# Patient Record
Sex: Female | Born: 1935
Health system: Southern US, Community
[De-identification: ages and names within clinical notes are randomized; demographics above are authoritative.]

## PROBLEM LIST (undated history)

## (undated) DIAGNOSIS — F411 Generalized anxiety disorder: Secondary | ICD-10-CM

## (undated) DIAGNOSIS — F0390 Unspecified dementia without behavioral disturbance: Secondary | ICD-10-CM

## (undated) DIAGNOSIS — I739 Peripheral vascular disease, unspecified: Secondary | ICD-10-CM

## (undated) DIAGNOSIS — J8410 Pulmonary fibrosis, unspecified: Secondary | ICD-10-CM

## (undated) DIAGNOSIS — G250 Essential tremor: Secondary | ICD-10-CM

## (undated) DIAGNOSIS — I10 Essential (primary) hypertension: Secondary | ICD-10-CM

## (undated) DIAGNOSIS — K219 Gastro-esophageal reflux disease without esophagitis: Secondary | ICD-10-CM

## (undated) DIAGNOSIS — I2510 Atherosclerotic heart disease of native coronary artery without angina pectoris: Secondary | ICD-10-CM

## (undated) DIAGNOSIS — E785 Hyperlipidemia, unspecified: Secondary | ICD-10-CM

## (undated) HISTORY — PX: KNEE ARTHROPLASTY: SHX992

## (undated) HISTORY — PX: CHOLECYSTECTOMY: SHX55

## (undated) HISTORY — PX: ABDOMINAL HYSTERECTOMY: SHX81

## (undated) HISTORY — PX: CORONARY ARTERY BYPASS GRAFT: SHX141

---

## 2019-07-15 ENCOUNTER — Ambulatory Visit
Admission: EM | Admit: 2019-07-15 | Discharge: 2019-07-15 | Disposition: A | Discharge status: Home / Self Care | Payer: Medicare HMO | Attending: Family Medicine | Admitting: Family Medicine

## 2019-07-15 ENCOUNTER — Encounter: Payer: Self-pay | Admitting: Emergency Medicine

## 2019-07-15 ENCOUNTER — Other Ambulatory Visit: Payer: Self-pay

## 2019-07-15 DIAGNOSIS — I10 Essential (primary) hypertension: Secondary | ICD-10-CM

## 2019-07-15 HISTORY — DX: Essential (primary) hypertension: I10

## 2019-07-15 NOTE — Discharge Instructions (Addendum)
Continue current medications including clonidine as needed for elevated blood pressures

## 2019-07-15 NOTE — ED Triage Notes (Signed)
Patient states that her blood pressure has been elevated for the past 2 days.  Patient denies HAS or vision changes.

## 2019-07-15 NOTE — ED Provider Notes (Signed)
[facility]    CSN: [id number] Arrival date & time: 07/15/19  1406     History   Chief Complaint Chief Complaint  Patient presents with  . Hypertension    HPI Alexis Jackson is a 85 y.o. female.   85 yo female accompanied by son with a c/o high blood pressure for the past 2 days. Patient states readings have been [phone] systolic. Denies any headaches, vision changes, one-sided weakness, numbness/tingling. Patient is on multiple daily blood pressure medications, including clonidine which is to be taken as needed. However she did not take it today because they wanted to check that her blood pressure machine was accurate.  Patient recently moved here from [place] and will be setting up with a PCP at [facility] clinic.     Hypertension    Past Medical History:  Diagnosis Date  . Hypertension     There are no active problems to display for this patient.   History reviewed. No pertinent surgical history.  OB History   No obstetric history on file.      Home Medications    Prior to Admission medications   Medication Sig Start Date End Date Taking? Authorizing Provider  aspirin EC 81 MG tablet Take 81 mg by mouth daily.   Yes [provider]  clonazePAM (KLONOPIN) 0.5 MG tablet Take 0.5 mg by mouth 2 (two) times daily as needed for anxiety.   Yes [provider]  cloNIDine (CATAPRES) 0.1 MG tablet Take 0.1 mg by mouth as needed.   Yes [provider]  famotidine (PEPCID) 40 MG tablet Take 40 mg by mouth daily.   Yes [provider]  gabapentin (NEURONTIN) 100 MG capsule  02/03/19  Yes [provider]  losartan-hydrochlorothiazide (HYZAAR) 100-12.5 MG tablet  05/20/19  Yes [provider]  propranolol (INDERAL) 10 MG tablet  05/15/19  Yes [provider]  amLODipine (NORVASC) 10 MG tablet  02/04/19   [provider]  amLODipine (NORVASC) 2.5 MG tablet  05/04/19   [provider]   busPIRone (BUSPAR) 5 MG tablet  07/09/19   [provider]    Family History History reviewed. No pertinent family history.  Social History Social History   Tobacco Use  . Smoking status: Former Research scientist (life sciences)  . Smokeless tobacco: Never Used  Substance Use Topics  . Alcohol use: Never    Frequency: Never  . Drug use: Never     Allergies   Sulfa antibiotics   Review of Systems Review of Systems   Physical Exam Triage Vital Signs ED Triage Vitals  Enc Vitals Group     BP 07/15/19 1441 (!) 155/62     Pulse Rate 07/15/19 1441 (!) 59     Resp 07/15/19 1441 14     Temp 07/15/19 1441 98.1 F (36.7 C)     Temp Source 07/15/19 1441 Oral     SpO2 07/15/19 1441 98 %     Weight 07/15/19 1440 115 lb (52.2 kg)     Height 07/15/19 1440 5\' 8"  (1.727 m)     Head Circumference --      Peak Flow --      Pain Score 07/15/19 1440 0     Pain Loc --      Pain Edu? --      Excl. in [place]? --    No data found.  Updated Vital Signs BP (!) 155/62 (BP Location: Left Arm)   Pulse (!) 59   Temp 98.1  F (36.7 C) (Oral)   Resp 14   Ht 5\' 8"  (1.727 m)   Wt 52.2 kg   SpO2 98%   BMI 17.49 kg/m   Visual Acuity Right Eye Distance:   Left Eye Distance:   Bilateral Distance:    Right Eye Near:   Left Eye Near:    Bilateral Near:     Physical Exam Vitals signs and nursing note reviewed.  Constitutional:      General: She is not in acute distress.    Appearance: She is not toxic-appearing or diaphoretic.  Cardiovascular:     Rate and Rhythm: Normal rate.  Pulmonary:     Effort: Pulmonary effort is normal. No respiratory distress.  Neurological:     Mental Status: She is alert.      UC Treatments / Results  Labs (all labs ordered are listed, but only abnormal results are displayed) Labs Reviewed - No data to display  EKG   Radiology No results found.  Procedures Procedures (including critical care time)  Medications Ordered in UC Medications - No data to  display  Initial Impression / Assessment and Plan / UC Course  I have reviewed the triage vital signs and the nursing notes.  Pertinent labs & imaging results that were available during my care of the patient were reviewed by me and considered in my medical decision making (see chart for details).      Final Clinical Impressions(s) / UC Diagnoses   Final diagnoses:  Hypertension, unspecified type     Discharge Instructions     Continue current medications including clonidine as needed for elevated blood pressures    ED Prescriptions    None      1.  diagnosis reviewed with patient and son 2. Recommend continue current medications 3. Establish care with new PCP as scheduled 4. Follow up prn   Controlled Substance Prescriptions [place] Controlled Substance Registry consulted? Not Applicable   Norval Gable, MD 07/15/19 [phone]

## 2019-08-23 DIAGNOSIS — Z79899 Other long term (current) drug therapy: Secondary | ICD-10-CM | POA: Insufficient documentation

## 2019-08-24 DIAGNOSIS — N183 Chronic kidney disease, stage 3 (moderate): Secondary | ICD-10-CM | POA: Diagnosis not present

## 2019-08-24 DIAGNOSIS — I739 Peripheral vascular disease, unspecified: Secondary | ICD-10-CM | POA: Diagnosis not present

## 2019-08-24 DIAGNOSIS — F0280 Dementia in other diseases classified elsewhere without behavioral disturbance: Secondary | ICD-10-CM | POA: Diagnosis not present

## 2019-08-24 DIAGNOSIS — I700 Atherosclerosis of aorta: Secondary | ICD-10-CM | POA: Diagnosis not present

## 2019-08-24 DIAGNOSIS — E559 Vitamin D deficiency, unspecified: Secondary | ICD-10-CM | POA: Diagnosis not present

## 2019-08-24 DIAGNOSIS — G250 Essential tremor: Secondary | ICD-10-CM | POA: Diagnosis not present

## 2019-08-24 DIAGNOSIS — I129 Hypertensive chronic kidney disease with stage 1 through stage 4 chronic kidney disease, or unspecified chronic kidney disease: Secondary | ICD-10-CM | POA: Diagnosis not present

## 2019-08-24 DIAGNOSIS — E785 Hyperlipidemia, unspecified: Secondary | ICD-10-CM | POA: Diagnosis not present

## 2019-08-24 DIAGNOSIS — F411 Generalized anxiety disorder: Secondary | ICD-10-CM | POA: Diagnosis not present

## 2019-08-28 DIAGNOSIS — L97522 Non-pressure chronic ulcer of other part of left foot with fat layer exposed: Secondary | ICD-10-CM | POA: Diagnosis not present

## 2019-08-28 DIAGNOSIS — M2041 Other hammer toe(s) (acquired), right foot: Secondary | ICD-10-CM | POA: Diagnosis not present

## 2019-08-28 DIAGNOSIS — M2042 Other hammer toe(s) (acquired), left foot: Secondary | ICD-10-CM | POA: Diagnosis not present

## 2019-08-28 DIAGNOSIS — G629 Polyneuropathy, unspecified: Secondary | ICD-10-CM | POA: Diagnosis not present

## 2019-08-28 DIAGNOSIS — M19072 Primary osteoarthritis, left ankle and foot: Secondary | ICD-10-CM | POA: Diagnosis not present

## 2019-08-28 DIAGNOSIS — M19071 Primary osteoarthritis, right ankle and foot: Secondary | ICD-10-CM | POA: Diagnosis not present

## 2019-09-17 ENCOUNTER — Emergency Department: Payer: Medicare HMO

## 2019-09-17 ENCOUNTER — Inpatient Hospital Stay
Admission: EM | Admit: 2019-09-17 | Discharge: 2019-09-23 | DRG: 470 | Disposition: A | Discharge status: Skilled Nursing Facility | Payer: Medicare HMO | Attending: Specialist | Admitting: Specialist

## 2019-09-17 ENCOUNTER — Other Ambulatory Visit: Payer: Self-pay

## 2019-09-17 DIAGNOSIS — Z96659 Presence of unspecified artificial knee joint: Secondary | ICD-10-CM | POA: Diagnosis present

## 2019-09-17 DIAGNOSIS — W19XXXA Unspecified fall, initial encounter: Secondary | ICD-10-CM | POA: Diagnosis not present

## 2019-09-17 DIAGNOSIS — S72002S Fracture of unspecified part of neck of left femur, sequela: Secondary | ICD-10-CM | POA: Diagnosis not present

## 2019-09-17 DIAGNOSIS — K219 Gastro-esophageal reflux disease without esophagitis: Secondary | ICD-10-CM | POA: Diagnosis present

## 2019-09-17 DIAGNOSIS — R001 Bradycardia, unspecified: Secondary | ICD-10-CM | POA: Diagnosis not present

## 2019-09-17 DIAGNOSIS — Y92000 Kitchen of unspecified non-institutional (private) residence as  the place of occurrence of the external cause: Secondary | ICD-10-CM

## 2019-09-17 DIAGNOSIS — I445 Left posterior fascicular block: Secondary | ICD-10-CM | POA: Diagnosis present

## 2019-09-17 DIAGNOSIS — S72002A Fracture of unspecified part of neck of left femur, initial encounter for closed fracture: Secondary | ICD-10-CM | POA: Diagnosis not present

## 2019-09-17 DIAGNOSIS — Z471 Aftercare following joint replacement surgery: Secondary | ICD-10-CM | POA: Diagnosis not present

## 2019-09-17 DIAGNOSIS — Z9049 Acquired absence of other specified parts of digestive tract: Secondary | ICD-10-CM

## 2019-09-17 DIAGNOSIS — Z20828 Contact with and (suspected) exposure to other viral communicable diseases: Secondary | ICD-10-CM | POA: Diagnosis present

## 2019-09-17 DIAGNOSIS — M79605 Pain in left leg: Secondary | ICD-10-CM | POA: Diagnosis not present

## 2019-09-17 DIAGNOSIS — J8410 Pulmonary fibrosis, unspecified: Secondary | ICD-10-CM | POA: Diagnosis present

## 2019-09-17 DIAGNOSIS — W010XXA Fall on same level from slipping, tripping and stumbling without subsequent striking against object, initial encounter: Secondary | ICD-10-CM | POA: Diagnosis present

## 2019-09-17 DIAGNOSIS — E785 Hyperlipidemia, unspecified: Secondary | ICD-10-CM | POA: Diagnosis not present

## 2019-09-17 DIAGNOSIS — R000 Tachycardia, unspecified: Secondary | ICD-10-CM | POA: Diagnosis not present

## 2019-09-17 DIAGNOSIS — I2510 Atherosclerotic heart disease of native coronary artery without angina pectoris: Secondary | ICD-10-CM | POA: Diagnosis present

## 2019-09-17 DIAGNOSIS — Z79899 Other long term (current) drug therapy: Secondary | ICD-10-CM

## 2019-09-17 DIAGNOSIS — Z882 Allergy status to sulfonamides status: Secondary | ICD-10-CM | POA: Diagnosis not present

## 2019-09-17 DIAGNOSIS — F411 Generalized anxiety disorder: Secondary | ICD-10-CM | POA: Diagnosis present

## 2019-09-17 DIAGNOSIS — S72009A Fracture of unspecified part of neck of unspecified femur, initial encounter for closed fracture: Secondary | ICD-10-CM | POA: Diagnosis not present

## 2019-09-17 DIAGNOSIS — Z90710 Acquired absence of both cervix and uterus: Secondary | ICD-10-CM | POA: Diagnosis not present

## 2019-09-17 DIAGNOSIS — R339 Retention of urine, unspecified: Secondary | ICD-10-CM | POA: Diagnosis not present

## 2019-09-17 DIAGNOSIS — I4510 Unspecified right bundle-branch block: Secondary | ICD-10-CM | POA: Diagnosis present

## 2019-09-17 DIAGNOSIS — Z419 Encounter for procedure for purposes other than remedying health state, unspecified: Secondary | ICD-10-CM

## 2019-09-17 DIAGNOSIS — S72001A Fracture of unspecified part of neck of right femur, initial encounter for closed fracture: Secondary | ICD-10-CM | POA: Diagnosis not present

## 2019-09-17 DIAGNOSIS — Z809 Family history of malignant neoplasm, unspecified: Secondary | ICD-10-CM | POA: Diagnosis not present

## 2019-09-17 DIAGNOSIS — R52 Pain, unspecified: Secondary | ICD-10-CM | POA: Diagnosis not present

## 2019-09-17 DIAGNOSIS — Z7401 Bed confinement status: Secondary | ICD-10-CM | POA: Diagnosis not present

## 2019-09-17 DIAGNOSIS — Z87891 Personal history of nicotine dependence: Secondary | ICD-10-CM | POA: Diagnosis not present

## 2019-09-17 DIAGNOSIS — I10 Essential (primary) hypertension: Secondary | ICD-10-CM | POA: Diagnosis present

## 2019-09-17 DIAGNOSIS — Z96649 Presence of unspecified artificial hip joint: Secondary | ICD-10-CM

## 2019-09-17 DIAGNOSIS — D62 Acute posthemorrhagic anemia: Secondary | ICD-10-CM | POA: Diagnosis not present

## 2019-09-17 DIAGNOSIS — I959 Hypotension, unspecified: Secondary | ICD-10-CM | POA: Diagnosis not present

## 2019-09-17 DIAGNOSIS — M6281 Muscle weakness (generalized): Secondary | ICD-10-CM | POA: Diagnosis not present

## 2019-09-17 DIAGNOSIS — Z01811 Encounter for preprocedural respiratory examination: Secondary | ICD-10-CM

## 2019-09-17 DIAGNOSIS — F0390 Unspecified dementia without behavioral disturbance: Secondary | ICD-10-CM | POA: Diagnosis present

## 2019-09-17 DIAGNOSIS — Y9301 Activity, walking, marching and hiking: Secondary | ICD-10-CM | POA: Diagnosis present

## 2019-09-17 DIAGNOSIS — Z01810 Encounter for preprocedural cardiovascular examination: Secondary | ICD-10-CM | POA: Diagnosis not present

## 2019-09-17 DIAGNOSIS — Z951 Presence of aortocoronary bypass graft: Secondary | ICD-10-CM | POA: Diagnosis not present

## 2019-09-17 DIAGNOSIS — S7292XA Unspecified fracture of left femur, initial encounter for closed fracture: Secondary | ICD-10-CM | POA: Diagnosis not present

## 2019-09-17 DIAGNOSIS — I739 Peripheral vascular disease, unspecified: Secondary | ICD-10-CM | POA: Diagnosis present

## 2019-09-17 DIAGNOSIS — G629 Polyneuropathy, unspecified: Secondary | ICD-10-CM | POA: Diagnosis present

## 2019-09-17 DIAGNOSIS — M2550 Pain in unspecified joint: Secondary | ICD-10-CM | POA: Diagnosis not present

## 2019-09-17 DIAGNOSIS — Z7982 Long term (current) use of aspirin: Secondary | ICD-10-CM

## 2019-09-17 DIAGNOSIS — S7002XA Contusion of left hip, initial encounter: Secondary | ICD-10-CM | POA: Diagnosis not present

## 2019-09-17 DIAGNOSIS — Z96642 Presence of left artificial hip joint: Secondary | ICD-10-CM | POA: Diagnosis not present

## 2019-09-17 DIAGNOSIS — Z03818 Encounter for observation for suspected exposure to other biological agents ruled out: Secondary | ICD-10-CM | POA: Diagnosis not present

## 2019-09-17 DIAGNOSIS — I25110 Atherosclerotic heart disease of native coronary artery with unstable angina pectoris: Secondary | ICD-10-CM | POA: Diagnosis not present

## 2019-09-17 DIAGNOSIS — W19XXXS Unspecified fall, sequela: Secondary | ICD-10-CM | POA: Diagnosis not present

## 2019-09-17 DIAGNOSIS — F39 Unspecified mood [affective] disorder: Secondary | ICD-10-CM | POA: Diagnosis not present

## 2019-09-17 DIAGNOSIS — Z01818 Encounter for other preprocedural examination: Secondary | ICD-10-CM | POA: Diagnosis not present

## 2019-09-17 DIAGNOSIS — I517 Cardiomegaly: Secondary | ICD-10-CM | POA: Diagnosis not present

## 2019-09-17 HISTORY — DX: Essential tremor: G25.0

## 2019-09-17 HISTORY — DX: Generalized anxiety disorder: F41.1

## 2019-09-17 HISTORY — DX: Unspecified dementia, unspecified severity, without behavioral disturbance, psychotic disturbance, mood disturbance, and anxiety: F03.90

## 2019-09-17 HISTORY — DX: Atherosclerotic heart disease of native coronary artery without angina pectoris: I25.10

## 2019-09-17 HISTORY — DX: Hyperlipidemia, unspecified: E78.5

## 2019-09-17 HISTORY — DX: Peripheral vascular disease, unspecified: I73.9

## 2019-09-17 HISTORY — DX: Pulmonary fibrosis, unspecified: J84.10

## 2019-09-17 HISTORY — DX: Gastro-esophageal reflux disease without esophagitis: K21.9

## 2019-09-17 LAB — CBC WITH DIFFERENTIAL/PLATELET
Abs Immature Granulocytes: 0.06 10*3/uL (ref 0.00–0.07)
Basophils Absolute: 0 10*3/uL (ref 0.0–0.1)
Basophils Relative: 0 %
Eosinophils Absolute: 0.1 10*3/uL (ref 0.0–0.5)
Eosinophils Relative: 1 %
HCT: 34 % — ABNORMAL LOW (ref 36.0–46.0)
Hemoglobin: 11.3 g/dL — ABNORMAL LOW (ref 12.0–15.0)
Immature Granulocytes: 0 %
Lymphocytes Relative: 8 %
Lymphs Abs: 1.1 10*3/uL (ref 0.7–4.0)
MCH: 30.1 pg (ref 26.0–34.0)
MCHC: 33.2 g/dL (ref 30.0–36.0)
MCV: 90.7 fL (ref 80.0–100.0)
Monocytes Absolute: 0.8 10*3/uL (ref 0.1–1.0)
Monocytes Relative: 6 %
Neutro Abs: 11.5 10*3/uL — ABNORMAL HIGH (ref 1.7–7.7)
Neutrophils Relative %: 85 %
Platelets: 205 10*3/uL (ref 150–400)
RBC: 3.75 MIL/uL — ABNORMAL LOW (ref 3.87–5.11)
RDW: 13.1 % (ref 11.5–15.5)
WBC: 13.6 10*3/uL — ABNORMAL HIGH (ref 4.0–10.5)
nRBC: 0 % (ref 0.0–0.2)

## 2019-09-17 LAB — BASIC METABOLIC PANEL
Anion gap: 11 (ref 5–15)
BUN: 28 mg/dL — ABNORMAL HIGH (ref 8–23)
CO2: 22 mmol/L (ref 22–32)
Calcium: 9.4 mg/dL (ref 8.9–10.3)
Chloride: 102 mmol/L (ref 98–111)
Creatinine, Ser: 1.07 mg/dL — ABNORMAL HIGH (ref 0.44–1.00)
GFR calc Af Amer: 53 mL/min — ABNORMAL LOW (ref >60)
GFR calc non Af Amer: 46 mL/min — ABNORMAL LOW (ref >60)
Glucose, Bld: 131 mg/dL — ABNORMAL HIGH (ref 70–99)
Potassium: 3.9 mmol/L (ref 3.5–5.1)
Sodium: 135 mmol/L (ref 135–145)

## 2019-09-17 LAB — PROTIME-INR
INR: 1.1 (ref 0.8–1.2)
Prothrombin Time: 13.7 seconds (ref 11.4–15.2)

## 2019-09-17 LAB — SARS CORONAVIRUS 2 BY RT PCR (HOSPITAL ORDER, PERFORMED IN ~~LOC~~ HOSPITAL LAB): SARS Coronavirus 2: NEGATIVE

## 2019-09-17 MED ORDER — CEFAZOLIN SODIUM-DEXTROSE 1-4 GM/50ML-% IV SOLN
1.0000 g | INTRAVENOUS | Status: AC
  Administered 2019-09-18: 12:00:00 1 g via INTRAVENOUS

## 2019-09-17 MED ORDER — PHENAZOPYRIDINE HCL 200 MG PO TABS
200.0000 mg | ORAL_TABLET | Freq: Once | ORAL | Status: AC
  Administered 2019-09-18: 200 mg via ORAL
  Filled 2019-09-17: qty 1

## 2019-09-17 MED ORDER — CEFAZOLIN SODIUM-DEXTROSE 1-4 GM/50ML-% IV SOLN: 1.0000 g | Freq: Three times a day (TID) | INTRAVENOUS | Status: DC

## 2019-09-17 MED ORDER — FENTANYL CITRATE (PF) 100 MCG/2ML IJ SOLN
25.0000 ug | Freq: Once | INTRAMUSCULAR | Status: AC
  Administered 2019-09-17: 25 ug via INTRAVENOUS
  Filled 2019-09-17: qty 2

## 2019-09-17 NOTE — ED Notes (Signed)
ED TO INPATIENT HANDOFF REPORT  ED Nurse Name and Phone #: gracie  S Name/Age/Gender [name] 85 y.o. female Room/Bed: ED04A/ED04A  Code Status   Code Status: Not on file  Home/SNF/Other Home Patient oriented to: self, place, time and situation Is this baseline? Yes   Triage Complete: Triage complete  Chief Complaint fall left leg pain ems  Triage Note Pt to ED via ems from home, pt c/o left leg pain post mechanical fall. Pt states she is on blood thinners. No obvious deformity noted. Did not hit head and denies LOC. Pulses present in affected leg.    Allergies Allergies  Allergen Reactions  . Sulfa Antibiotics Rash    Level of Care/Admitting Diagnosis ED Disposition    ED Disposition Condition [facility]: [place] [[id number]]  Level of Care: Med-Surg [16]  Covid Evaluation: Asymptomatic Screening Protocol (No Symptoms)  [facility]: [id number]  Admitting Physician: Lance Coon [id number]  Attending Physician: Lance Coon [phone]  Estimated length of stay: past midnight tomorrow  Certification:: I certify this patient will need inpatient services for at least 2 midnights  PT Class (Do Not Modify): Inpatient [101]  PT Acc Code (Do Not Modify): Private [1]       B Medical/Surgery History Past Medical History:  Diagnosis Date  . Benign essential tremor   . CAD (coronary artery disease)   . Dementia ([place])   . GAD (generalized anxiety disorder)   . GERD (gastroesophageal reflux disease)   . HLD (hyperlipidemia)   . Hypertension   . PAD (peripheral artery disease) ([place])   . Pulmonary fibrosis ([place])    Past Surgical History:  Procedure Laterality Date  . ABDOMINAL HYSTERECTOMY    . CHOLECYSTECTOMY    . CORONARY ARTERY BYPASS GRAFT    . KNEE ARTHROPLASTY       A IV Location/Drains/Wounds Patient Lines/Drains/Airways Status   Active Line/Drains/Airways    Name:   Placement date:    Placement time:   Site:   Days:   Urethral Catheter gracie rn Double-lumen 14 Fr.   09/17/19    2324    Double-lumen   less than 1          Intake/Output Last 24 hours No intake or output data in the 24 hours ending 09/17/19 2324  Labs/Imaging Results for orders placed or performed during the hospital encounter of 09/17/19 (from the past 48 hour(s))  CBC with Differential     Status: Abnormal   Collection Time: 09/17/19  9:52 PM  Result Value Ref Range   WBC 13.6 (H) 4.0 - 10.5 K/uL   RBC 3.75 (L) 3.87 - 5.11 MIL/uL   Hemoglobin 11.3 (L) 12.0 - 15.0 g/dL   HCT 34.0 (L) 36.0 - 46.0 %   MCV 90.7 80.0 - 100.0 fL   MCH 30.1 26.0 - 34.0 pg   MCHC 33.2 30.0 - 36.0 g/dL   RDW 13.1 11.5 - 15.5 %   Platelets 205 150 - 400 K/uL   nRBC 0.0 0.0 - 0.2 %   Neutrophils Relative % 85 %   Neutro Abs 11.5 (H) 1.7 - 7.7 K/uL   Lymphocytes Relative 8 %   Lymphs Abs 1.1 0.7 - 4.0 K/uL   Monocytes Relative 6 %   Monocytes Absolute 0.8 0.1 - 1.0 K/uL   Eosinophils Relative 1 %   Eosinophils Absolute 0.1 0.0 - 0.5 K/uL   Basophils Relative 0 %   Basophils Absolute  0.0 0.0 - 0.1 K/uL   Immature Granulocytes 0 %   Abs Immature Granulocytes 0.06 0.00 - 0.07 K/uL    Comment: Performed at [facility], [address] [place], [place] [address]  Protime-INR     Status: None   Collection Time: 09/17/19  9:52 PM  Result Value Ref Range   Prothrombin Time 13.7 11.4 - 15.2 seconds   INR 1.1 0.8 - 1.2    Comment: (NOTE) INR goal varies based on device and disease states. Performed at [facility], [address] [place], [place] [id number]   Basic metabolic panel     Status: Abnormal   Collection Time: 09/17/19  9:52 PM  Result Value Ref Range   Sodium 135 135 - 145 mmol/L   Potassium 3.9 3.5 - 5.1 mmol/L   Chloride 102 98 - 111 mmol/L   CO2 22 22 - 32 mmol/L   Glucose, Bld 131 (H) 70 - 99 mg/dL   BUN 28 (H) 8 - 23 mg/dL   Creatinine, Ser 1.07 (H) 0.44 - 1.00 mg/dL    Calcium 9.4 8.9 - 10.3 mg/dL   GFR calc non Af Amer 46 (L) >60 mL/min   GFR calc Af Amer 53 (L) >60 mL/min   Anion gap 11 5 - 15    Comment: Performed at [facility], [address] [place], [place] [address]  SARS Coronavirus 2 [facility] order, Performed in [facility] hospital lab) Nasopharyngeal Nasopharyngeal Swab     Status: None   Collection Time: 09/17/19  9:52 PM   Specimen: Nasopharyngeal Swab  Result Value Ref Range   SARS Coronavirus 2 NEGATIVE NEGATIVE    Comment: (NOTE) If result is NEGATIVE SARS-CoV-2 target nucleic acids are NOT DETECTED. The SARS-CoV-2 RNA is generally detectable in upper and lower  respiratory specimens during the acute phase of infection. The lowest  concentration of SARS-CoV-2 viral copies this assay can detect is 250  copies / mL. A negative result does not preclude SARS-CoV-2 infection  and should not be used as the sole basis for treatment or other  patient management decisions.  A negative result may occur with  improper specimen collection / handling, submission of specimen other  than nasopharyngeal swab, presence of viral mutation(s) within the  areas targeted by this assay, and inadequate number of viral copies  (<250 copies / mL). A negative result must be combined with clinical  observations, patient history, and epidemiological information. If result is POSITIVE SARS-CoV-2 target nucleic acids are DETECTED. The SARS-CoV-2 RNA is generally detectable in upper and lower  respiratory specimens dur ing the acute phase of infection.  Positive  results are indicative of active infection with SARS-CoV-2.  Clinical  correlation with patient history and other diagnostic information is  necessary to determine patient infection status.  Positive results do  not rule out bacterial infection or co-infection with other viruses. If result is PRESUMPTIVE POSTIVE SARS-CoV-2 nucleic acids MAY BE PRESENT.   A presumptive positive result was  obtained on the submitted specimen  and confirmed on repeat testing.  While 2019 novel coronavirus  (SARS-CoV-2) nucleic acids may be present in the submitted sample  additional confirmatory testing may be necessary for epidemiological  and / or clinical management purposes  to differentiate between  SARS-CoV-2 and other Sarbecovirus currently known to infect humans.  If clinically indicated additional testing with an alternate test  methodology [phone]) is advised. The SARS-CoV-2 RNA is generally  detectable in upper and lower  respiratory sp ecimens during the acute  phase of infection. The expected result is Negative. Fact Sheet for Patients:  StrictlyIdeas.no Fact Sheet for Healthcare Providers: BankingDealers.co.za This test is not yet approved or cleared by the [place] FDA and has been authorized for detection and/or diagnosis of SARS-CoV-2 by FDA under an Emergency Use Authorization (EUA).  This EUA will remain in effect (meaning this test can be used) for the duration of the COVID-19 declaration under Section 564(b)(1) of the Act, 21 U.S.C. section 360bbb-3(b)(1), unless the authorization is terminated or revoked sooner. Performed at [facility], [address] [place],  [address]    Dg Knee 1-2 Views Left  Result Date: 09/17/2019 CLINICAL DATA:  Left leg pain secondary to a fall. EXAM: LEFT KNEE - 1-2 VIEW COMPARISON:  None. FINDINGS: There is no fracture or dislocation or joint effusion. The components of the left total knee prosthesis appear in good position with no loosening. Diffuse osteopenia. IMPRESSION: No acute abnormality. The components of the left total knee prosthesis are in good position with no loosening. Electronically Signed   By: Lorriane Shire M.D.   On: 09/17/2019 20:53   Ct Hip Left Wo Contrast  Result Date: 09/17/2019 CLINICAL DATA:  Left hip fracture secondary to a fall at home. EXAM:  CT OF THE LEFT HIP WITHOUT CONTRAST TECHNIQUE: Multidetector CT imaging of the left hip was performed according to the standard protocol. Multiplanar CT image reconstructions were also generated. COMPARISON:  Radiographs dated 09/17/2019 FINDINGS: Bones/Joint/Cartilage There is a slightly comminuted angulated overriding benign-appearing fracture of the left femoral neck. Muscles and Tendons Normal. Soft tissues Prominent contusion of the subcutaneous fat lateral to the left greater trochanter. IMPRESSION: 1. Slightly comminuted angulated overriding benign-appearing fracture of the left femoral neck. 2. Prominent contusion of the subcutaneous fat lateral to the left greater trochanter. Electronically Signed   By: Lorriane Shire M.D.   On: 09/17/2019 21:57   Dg Chest Port 1 View  Result Date: 09/17/2019 CLINICAL DATA:  Pre operative respiratory exam. EXAM: PORTABLE CHEST 1 VIEW COMPARISON:  None FINDINGS: Mild cardiomegaly. Aortic atherosclerosis. CABG. No infiltrates or effusions. No acute bone abnormality. IMPRESSION: 1. Mild cardiomegaly. 2. Aortic atherosclerosis. 3. No acute abnormalities. Electronically Signed   By: Lorriane Shire M.D.   On: 09/17/2019 21:50   Dg Hip Unilat With Pelvis 2-3 Views Left  Result Date: 09/17/2019 CLINICAL DATA:  Left leg pain after a fall. EXAM: DG HIP (WITH OR WITHOUT PELVIS) 2-3V LEFT COMPARISON:  None. FINDINGS: There is an angulated left femoral neck fracture. No dislocation of the femoral head. Diffuse osteopenia. Aortic atherosclerosis. IMPRESSION: 1. Left femoral neck fracture. No dislocation of the femoral head. 2. Aortic atherosclerosis. Electronically Signed   By: Lorriane Shire M.D.   On: 09/17/2019 20:52    Pending Labs [employer] (From admission, onward)    Start     Ordered   Signed and Occupational hygienist morning,   R     Signed and Held   Signed and Held  CBC  Tomorrow morning,   R     Signed and Held           Vitals/Pain Today's Vitals   09/17/19 1959 09/17/19 2005 09/17/19 2254  BP:  (!) 158/68   Pulse:  61   Resp:  16   Temp:  97.8 F (36.6 C)   TempSrc:  Oral   SpO2:  98%   Weight: 59 kg  Height: 5\' 7"  (1.702 m)    PainSc: 10-Worst pain ever  0-No pain    Isolation Precautions No active isolations  Medications Medications  ceFAZolin (ANCEF) IVPB 1 g/50 mL premix (has no administration in time range)  fentaNYL (SUBLIMAZE) injection 25 mcg (25 mcg Intravenous Given 09/17/19 2149)    Mobility walks with device Low fall risk   Focused Assessments ortho   R Recommendations: See Admitting Provider Note  Report given to:   Additional Notes:

## 2019-09-17 NOTE — ED Triage Notes (Signed)
Pt to ED via ems from home, pt c/o left leg pain post mechanical fall. Pt states she is on blood thinners. No obvious deformity noted. Did not hit head and denies LOC. Pulses present in affected leg.

## 2019-09-17 NOTE — H&P (Signed)
[facility] [place] NAME: Alexis Jackson    MR#:  [id number]  DATE OF BIRTH:  06-12-1927  DATE OF ADMISSION:  09/17/2019  PRIMARY CARE PHYSICIAN: Ezequiel Kayser, MD   REQUESTING/REFERRING PHYSICIAN: Joan Mayans, MD  CHIEF COMPLAINT:   Chief Complaint  Patient presents with  . Fall    HISTORY OF PRESENT ILLNESS:  Alexis Jackson  is a 85 y.o. female who presents with chief complaint as above.  Patient presents the ED after a fall at home with complaint of left hip pain.  She is found on imaging here to have a left hip fracture.  Orthopedic surgery was contacted by ED physician and they plan for operative repair tomorrow.  However, the patient does have a history of coronary bypass, and orthopedics and anesthesia would like cardiology consult for cardiac clearance prior to surgery.  Hospitalist were called for admission  PAST MEDICAL HISTORY:   Past Medical History:  Diagnosis Date  . Benign essential tremor   . CAD (coronary artery disease)   . Dementia ([place])   . GAD (generalized anxiety disorder)   . GERD (gastroesophageal reflux disease)   . HLD (hyperlipidemia)   . Hypertension   . PAD (peripheral artery disease) ([place])   . Pulmonary fibrosis ([place])      PAST SURGICAL HISTORY:   Past Surgical History:  Procedure Laterality Date  . ABDOMINAL HYSTERECTOMY    . CHOLECYSTECTOMY    . CORONARY ARTERY BYPASS GRAFT    . KNEE ARTHROPLASTY       SOCIAL HISTORY:   Social History   Tobacco Use  . Smoking status: Former Research scientist (life sciences)  . Smokeless tobacco: Never Used  Substance Use Topics  . Alcohol use: Never    Frequency: Never     FAMILY HISTORY:   Family History  Problem Relation Age of Onset  . Testicular cancer Father      DRUG ALLERGIES:   Allergies  Allergen Reactions  . Sulfa Antibiotics Rash    MEDICATIONS AT HOME:   Prior to Admission medications   Medication Sig Start Date End Date Taking? Authorizing  Provider  amLODipine (NORVASC) 10 MG tablet  02/04/19   [provider]  amLODipine (NORVASC) 2.5 MG tablet  05/04/19   [provider]  aspirin EC 81 MG tablet Take 81 mg by mouth daily.    [provider]  busPIRone (BUSPAR) 5 MG tablet  07/09/19   [provider]  clonazePAM (KLONOPIN) 0.5 MG tablet Take 0.5 mg by mouth 2 (two) times daily as needed for anxiety.    [provider]  cloNIDine (CATAPRES) 0.1 MG tablet Take 0.1 mg by mouth as needed.    [provider]  famotidine (PEPCID) 40 MG tablet Take 40 mg by mouth daily.    [provider]  gabapentin (NEURONTIN) 100 MG capsule  02/03/19   [provider]  losartan-hydrochlorothiazide Konrad Penta) 100-12.5 MG tablet  05/20/19   [provider]  propranolol (INDERAL) 10 MG tablet  05/15/19   [provider]    REVIEW OF SYSTEMS:  Review of Systems  Constitutional: Negative for chills, fever, malaise/fatigue and weight loss.  HENT: Negative for ear pain, hearing loss and tinnitus.   Eyes: Negative for blurred vision, double vision, pain and redness.  Respiratory: Negative for cough, hemoptysis and shortness of breath.   Cardiovascular: Negative for chest pain, palpitations, orthopnea and leg swelling.  Gastrointestinal: Negative for abdominal pain, constipation, diarrhea, nausea  and vomiting.  Genitourinary: Negative for dysuria, frequency and hematuria.  Musculoskeletal: Positive for falls and joint pain (left hip). Negative for back pain and neck pain.  Skin:       No acne, rash, or lesions  Neurological: Negative for dizziness, tremors, focal weakness and weakness.  Endo/Heme/Allergies: Negative for polydipsia. Does not bruise/bleed easily.  Psychiatric/Behavioral: Negative for depression. The patient is not nervous/anxious and does not have insomnia.      VITAL SIGNS:   Vitals:   09/17/19 1959 09/17/19 2005  BP:  (!) 158/68  Pulse:  61  Resp:  16   Temp:  97.8 F (36.6 C)  TempSrc:  Oral  SpO2:  98%  Weight: 59 kg   Height: 5\' 7"  (1.702 m)    Wt Readings from Last 3 Encounters:  09/17/19 59 kg  07/15/19 52.2 kg    PHYSICAL EXAMINATION:  Physical Exam  Vitals reviewed. Constitutional: She is oriented to person, place, and time. She appears well-developed and well-nourished. No distress.  HENT:  Head: Normocephalic and atraumatic.  Mouth/Throat: Oropharynx is clear and moist.  Eyes: Pupils are equal, round, and reactive to light. Conjunctivae and EOM are normal. No scleral icterus.  Neck: Normal range of motion. Neck supple. No JVD present. No thyromegaly present.  Cardiovascular: Regular rhythm and intact distal pulses. Exam reveals no gallop and no friction rub.  No murmur heard. Borderline bradycardic  Respiratory: Effort normal and breath sounds normal. No respiratory distress. She has no wheezes. She has no rales.  GI: Soft. Bowel sounds are normal. She exhibits no distension. There is no abdominal tenderness.  Musculoskeletal: Normal range of motion.        General: Tenderness (left hip) present. No edema.     Comments: No arthritis, no gout  Lymphadenopathy:    She has no cervical adenopathy.  Neurological: She is alert and oriented to person, place, and time. No cranial nerve deficit.  No dysarthria, no aphasia  Skin: Skin is warm and dry. No rash noted. No erythema.  Psychiatric: She has a normal mood and affect. Her behavior is normal. Judgment and thought content normal.    LABORATORY PANEL:   CBC Recent Labs  Lab 09/17/19 2152  WBC 13.6*  HGB 11.3*  HCT 34.0*  PLT 205   ------------------------------------------------------------------------------------------------------------------  Chemistries  Recent Labs  Lab 09/17/19 2152  NA 135  K 3.9  CL 102  CO2 22  GLUCOSE 131*  BUN 28*  CREATININE 1.07*  CALCIUM 9.4    ------------------------------------------------------------------------------------------------------------------  Cardiac Enzymes No results for input(s): TROPONINI in the last 168 hours. ------------------------------------------------------------------------------------------------------------------  RADIOLOGY:  Dg Knee 1-2 Views Left  Result Date: 09/17/2019 CLINICAL DATA:  Left leg pain secondary to a fall. EXAM: LEFT KNEE - 1-2 VIEW COMPARISON:  None. FINDINGS: There is no fracture or dislocation or joint effusion. The components of the left total knee prosthesis appear in good position with no loosening. Diffuse osteopenia. IMPRESSION: No acute abnormality. The components of the left total knee prosthesis are in good position with no loosening. Electronically Signed   By: Lorriane Shire M.D.   On: 09/17/2019 20:53   Ct Hip Left Wo Contrast  Result Date: 09/17/2019 CLINICAL DATA:  Left hip fracture secondary to a fall at home. EXAM: CT OF THE LEFT HIP WITHOUT CONTRAST TECHNIQUE: Multidetector CT imaging of the left hip was performed according to the standard protocol. Multiplanar CT image reconstructions were also generated. COMPARISON:  Radiographs dated 09/17/2019 FINDINGS: Bones/Joint/Cartilage There is  a slightly comminuted angulated overriding benign-appearing fracture of the left femoral neck. Muscles and Tendons Normal. Soft tissues Prominent contusion of the subcutaneous fat lateral to the left greater trochanter. IMPRESSION: 1. Slightly comminuted angulated overriding benign-appearing fracture of the left femoral neck. 2. Prominent contusion of the subcutaneous fat lateral to the left greater trochanter. Electronically Signed   By: Lorriane Shire M.D.   On: 09/17/2019 21:57   Dg Chest Port 1 View  Result Date: 09/17/2019 CLINICAL DATA:  Pre operative respiratory exam. EXAM: PORTABLE CHEST 1 VIEW COMPARISON:  None FINDINGS: Mild cardiomegaly. Aortic atherosclerosis. CABG. No  infiltrates or effusions. No acute bone abnormality. IMPRESSION: 1. Mild cardiomegaly. 2. Aortic atherosclerosis. 3. No acute abnormalities. Electronically Signed   By: Lorriane Shire M.D.   On: 09/17/2019 21:50   Dg Hip Unilat With Pelvis 2-3 Views Left  Result Date: 09/17/2019 CLINICAL DATA:  Left leg pain after a fall. EXAM: DG HIP (WITH OR WITHOUT PELVIS) 2-3V LEFT COMPARISON:  None. FINDINGS: There is an angulated left femoral neck fracture. No dislocation of the femoral head. Diffuse osteopenia. Aortic atherosclerosis. IMPRESSION: 1. Left femoral neck fracture. No dislocation of the femoral head. 2. Aortic atherosclerosis. Electronically Signed   By: Lorriane Shire M.D.   On: 09/17/2019 20:52    EKG:   Orders placed or performed during the hospital encounter of 09/17/19  . ED EKG  . ED EKG  . EKG 12-Lead  . EKG 12-Lead    IMPRESSION AND PLAN:  Principal Problem:   Hip fracture ([place]) -left hip, reportedly occurred after mechanical fall.  Orthopedic surgery is aware and plans for operative repair after cardiac clearance by cardiology. Active Problems:   HTN (hypertension) -home dose antihypertensives   CAD (coronary artery disease) -continue home meds.  Cardiology consult for cardiac clearance for surgery   GAD (generalized anxiety disorder) -home dose anxiolytic   GERD (gastroesophageal reflux disease) -home dose H2 blocker   Chart review performed and case discussed with ED provider. Labs, imaging and/or ECG reviewed by provider and discussed with patient/family. Management plans discussed with the patient and/or family.  COVID-19 status: Pending  DVT PROPHYLAXIS: Mechanical only  GI PROPHYLAXIS:  H2 blocker  ADMISSION STATUS: Inpatient     CODE STATUS: Full  TOTAL TIME TAKING CARE OF THIS PATIENT: 45 minutes.   This patient was evaluated in the context of the global COVID-19 pandemic, which necessitated consideration that the patient might be at risk for infection  with the SARS-CoV-2 virus that causes COVID-19. Institutional protocols and algorithms that pertain to the evaluation of patients at risk for COVID-19 are in a state of rapid change based on information released by regulatory bodies including the CDC and federal and state organizations. These policies and algorithms were followed to the best of this provider's knowledge to date during the patient's care at this facility.  Ethlyn Daniels 09/17/2019, 11:12 PM  [facility]  Office  [phone]  CC: Primary care physician; Ezequiel Kayser, MD  Note:  This document was prepared using Dragon voice recognition software and may include unintentional dictation errors.

## 2019-09-17 NOTE — ED Provider Notes (Signed)
[facility] Emergency Department Provider Note  ____________________________________________   First MD Initiated Contact with Patient 09/17/19 1958     (approximate)  I have reviewed the triage vital signs and the nursing notes.  History  Chief Complaint Fall    HPI Alexis Jackson is a 85 y.o. female with a history of hypertension, peripheral neuropathy, hyperlipidemia, CAD who presents emergency department after a mechanical fall.  She states she was walking in her kitchen when she thinks she may have tripped and fell. She fell onto her left side. No head injury or loss of consciousness. She denies any preceding presyncopal symptoms - no chest pain, palpitations, shortness of breath, lightheadedness, dizziness, headache, or visual changes.  She reports resultant left leg pain, and inability to ambulate afterwards. Pain is moderate in severity, includes her entire LLE.         Past Medical Hx Past Medical History:  Diagnosis Date  . Hypertension     Problem List There are no active problems to display for this patient.   Past Surgical Hx History reviewed. No pertinent surgical history.  Medications Prior to Admission medications   Medication Sig Start Date End Date Taking? Authorizing Provider  amLODipine (NORVASC) 10 MG tablet  02/04/19   [provider]  amLODipine (NORVASC) 2.5 MG tablet  05/04/19   [provider]  aspirin EC 81 MG tablet Take 81 mg by mouth daily.    [provider]  busPIRone (BUSPAR) 5 MG tablet  07/09/19   [provider]  clonazePAM (KLONOPIN) 0.5 MG tablet Take 0.5 mg by mouth 2 (two) times daily as needed for anxiety.    [provider]  cloNIDine (CATAPRES) 0.1 MG tablet Take 0.1 mg by mouth as needed.    [provider]  famotidine (PEPCID) 40 MG tablet Take 40 mg by mouth daily.    [provider]  gabapentin (NEURONTIN) 100 MG capsule  02/03/19   [provider]  losartan-hydrochlorothiazide Konrad Penta) 100-12.5 MG tablet  05/20/19   [provider]  propranolol (INDERAL) 10 MG tablet  05/15/19   [provider]    Allergies Sulfa antibiotics  Family Hx No family history on file.  Social Hx Social History   Tobacco Use  . Smoking status: Former Research scientist (life sciences)  . Smokeless tobacco: Never Used  Substance Use Topics  . Alcohol use: Never    Frequency: Never  . Drug use: Never     Review of Systems  Constitutional: Negative for fever. Negative for chills. Eyes: Negative for visual changes. ENT: Negative for sore throat. Cardiovascular: Negative for chest pain. Respiratory: Negative for shortness of breath. Gastrointestinal: Negative for abdominal pain. Negative for nausea. Negative for vomiting. Genitourinary: Negative for dysuria. Musculoskeletal: + left leg pain Skin: Negative for rash. Neurological: Negative for for headaches.   Physical Exam  Vital Signs: ED Triage Vitals  Enc Vitals Group     BP 09/17/19 2005 (!) 158/68     Pulse Rate 09/17/19 2005 61     Resp 09/17/19 2005 16     Temp 09/17/19 2005 97.8 F (36.6 C)     Temp Source 09/17/19 2005 Oral     SpO2 09/17/19 2005 98 %     Weight 09/17/19 1959 130 lb (59 kg)     Height 09/17/19 1959 5\' 7"  (1.702 m)     Head Circumference --      Peak Flow --      Pain Score 09/17/19  1959 10     Pain Loc --      Pain Edu? --      Excl. in [place]? --     Constitutional: Alert and oriented.  Eyes: Conjunctivae clear. Sclera anicteric. Head: Normocephalic. Atraumatic. Nose: No congestion. No rhinorrhea. Mouth/Throat: Mucous membranes are moist.  Neck: No stridor.  No midline CS tenderness.  Cardiovascular: Normal rate, regular rhythm. Old, well healed midline sternotomy scar. Extremities well perfused. Toes WWP. Respiratory: Normal respiratory effort.  Lungs CTAB. Chest wall stable and NT.  Gastrointestinal: Soft and non-tender. No distention.   Musculoskeletal: TTP at L hip and about L knee, ROM deferred due to tenderness. Able to wiggle toes and plantar/dorsiflex on L without difficulty. Remainder of extremities NT and w/o deformity.  Neurologic:  Normal speech and language. No gross focal neurologic deficits are appreciated.  Skin: Skin is warm, dry and intact. Ecchymosis to dorsum of left hand w/o associated tenderness or deformity. Psychiatric: Mood and affect are appropriate for situation.  EKG  Personally reviewed.   Rate: 57 Rhythm: sinus Axis: none Intervals: QTc 506 ms RBBB No acute ischemic changes No STEMI    Radiology  XR hip:  IMPRESSION: 1. Left femoral neck fracture. No dislocation of the femoral head. 2. Aortic atherosclerosis.  XR knee: IMPRESSION: No acute abnormality. The components of the left total knee prosthesis are in good position with no loosening.  XR chest: IMPRESSION: 1. Mild cardiomegaly. 2. Aortic atherosclerosis. 3. No acute abnormalities.  CT hip: IMPRESSION: 1. Slightly comminuted angulated overriding benign-appearing fracture of the left femoral neck. 2. Prominent contusion of the subcutaneous fat lateral to the left greater trochanter.     Procedures  Procedure(s) performed (including critical care):  Procedures   Initial Impression / Assessment and Plan / ED Course  85 y.o. female who presents to the ED for a mechanical fall with resultant left lower extremity pain.  Ddx: Fracture, contusion, sprain.  Plan: will obtain imaging; basic blood work, EKG to evaluate for any potential underlying etiology for the patient's fall, though on presentation seems most consistent with a mechanical fall.  Work-up reveals left femoral neck fracture.  Discussed with orthopedics, added on CT hip per their request.  COVID swab in preparation for operative intervention.  Discussed with hospitalist for admission.   Final Clinical Impression(s) / ED Diagnosis  Final  diagnoses:  Fall, initial encounter  Closed fracture of neck of left femur, initial encounter [facility])       Note:  This document was prepared using Dragon voice recognition software and may include unintentional dictation errors.   Lilia Pro., MD 09/17/19 [phone]

## 2019-09-17 NOTE — Progress Notes (Addendum)
Full consult note and discussion with patient to follow tomorrow AM.  I have talked to the patient's son over the phone and discussed the diagnosis and treatment options. We agreed to proceed with surgery. Per son, patient not on any anticoagulation (other than 81mg  ASA). Patient has had remote history of bypass surgery > 15 years ago and has been stable without any issue from [place] cardiologist [place] ([place]). Patient ambulates with cane at baseline. Lives with son since patient's husband passed away earlier this year.  Called by ED staff. Imaging reviewed.  - Plan for surgery tomorrow, likely ~11am.  - NPO after midnight - Hold anticoagulation. - Admit to Hospitalist team. Discussed case with on-call anesthesiologist. Given history of bypass surgery, recommend Cardiology consult for clearance first thing in the AM in advance of planned surgery.

## 2019-09-17 NOTE — ED Notes (Signed)
Patient transported to X-ray 

## 2019-09-18 ENCOUNTER — Inpatient Hospital Stay: Payer: Medicare HMO | Admitting: Anesthesiology

## 2019-09-18 ENCOUNTER — Inpatient Hospital Stay: Payer: Medicare HMO

## 2019-09-18 ENCOUNTER — Encounter
Admission: EM | Disposition: A | Discharge status: Skilled Nursing Facility | Payer: Self-pay | Source: Home / Self Care | Attending: Specialist

## 2019-09-18 ENCOUNTER — Other Ambulatory Visit: Payer: Self-pay

## 2019-09-18 ENCOUNTER — Encounter: Payer: Self-pay | Admitting: Certified Registered Nurse Anesthetist

## 2019-09-18 HISTORY — PX: HIP ARTHROPLASTY: SHX981

## 2019-09-18 LAB — BASIC METABOLIC PANEL
Anion gap: 9 (ref 5–15)
BUN: 26 mg/dL — ABNORMAL HIGH (ref 8–23)
CO2: 25 mmol/L (ref 22–32)
Calcium: 9.1 mg/dL (ref 8.9–10.3)
Chloride: 100 mmol/L (ref 98–111)
Creatinine, Ser: 0.93 mg/dL (ref 0.44–1.00)
GFR calc Af Amer: >60 mL/min (ref >60)
GFR calc non Af Amer: 54 mL/min — ABNORMAL LOW (ref >60)
Glucose, Bld: 132 mg/dL — ABNORMAL HIGH (ref 70–99)
Potassium: 3.9 mmol/L (ref 3.5–5.1)
Sodium: 134 mmol/L — ABNORMAL LOW (ref 135–145)

## 2019-09-18 LAB — CBC
HCT: 33.4 % — ABNORMAL LOW (ref 36.0–46.0)
Hemoglobin: 11.1 g/dL — ABNORMAL LOW (ref 12.0–15.0)
MCH: 30.2 pg (ref 26.0–34.0)
MCHC: 33.2 g/dL (ref 30.0–36.0)
MCV: 91 fL (ref 80.0–100.0)
Platelets: 195 10*3/uL (ref 150–400)
RBC: 3.67 MIL/uL — ABNORMAL LOW (ref 3.87–5.11)
RDW: 13.1 % (ref 11.5–15.5)
WBC: 15.2 10*3/uL — ABNORMAL HIGH (ref 4.0–10.5)
nRBC: 0 % (ref 0.0–0.2)

## 2019-09-18 LAB — SURGICAL PCR SCREEN
MRSA, PCR: POSITIVE — AB
Staphylococcus aureus: POSITIVE — AB

## 2019-09-18 SURGERY — HEMIARTHROPLASTY, HIP, DIRECT ANTERIOR APPROACH, FOR FRACTURE
Anesthesia: Spinal | Site: Hip | Laterality: Left

## 2019-09-18 MED ORDER — GABAPENTIN 100 MG PO CAPS
200.0000 mg | ORAL_CAPSULE | Freq: Three times a day (TID) | ORAL | Status: DC
  Administered 2019-09-18 – 2019-09-23 (×14): 200 mg via ORAL
  Filled 2019-09-18 (×14): qty 2

## 2019-09-18 MED ORDER — GLYCOPYRROLATE 0.2 MG/ML IJ SOLN
INTRAMUSCULAR | Status: DC | PRN
  Administered 2019-09-18: 0.2 mg via INTRAVENOUS

## 2019-09-18 MED ORDER — MUPIROCIN 2 % EX OINT
1.0000 "application " | TOPICAL_OINTMENT | Freq: Two times a day (BID) | CUTANEOUS | Status: DC
  Administered 2019-09-18 (×2): 1 via NASAL
  Filled 2019-09-18: qty 22

## 2019-09-18 MED ORDER — LACTATED RINGERS IV SOLN
INTRAVENOUS | Status: DC | PRN
  Administered 2019-09-18: 11:00:00 via INTRAVENOUS

## 2019-09-18 MED ORDER — BUPIVACAINE LIPOSOME 1.3 % IJ SUSP
INTRAMUSCULAR | Status: DC | PRN
  Administered 2019-09-18: 50 mL

## 2019-09-18 MED ORDER — TRANEXAMIC ACID-NACL 1000-0.7 MG/100ML-% IV SOLN
1000.0000 mg | Freq: Once | INTRAVENOUS | Status: AC
  Administered 2019-09-18: 14:00:00 1000 mg via INTRAVENOUS
  Filled 2019-09-18: qty 100

## 2019-09-18 MED ORDER — KETOROLAC TROMETHAMINE 30 MG/ML IJ SOLN
30.0000 mg | Freq: Once | INTRAMUSCULAR | Status: AC
  Administered 2019-09-18: 22:00:00 30 mg via INTRAVENOUS
  Filled 2019-09-18: qty 1

## 2019-09-18 MED ORDER — BUPIVACAINE HCL (PF) 0.5 % IJ SOLN
INTRAMUSCULAR | Status: DC | PRN
  Administered 2019-09-18: 3 mL

## 2019-09-18 MED ORDER — PRAVASTATIN SODIUM 20 MG PO TABS
20.0000 mg | ORAL_TABLET | Freq: Every evening | ORAL | Status: DC
  Administered 2019-09-19 – 2019-09-22 (×4): 20 mg via ORAL
  Filled 2019-09-18 (×4): qty 1

## 2019-09-18 MED ORDER — ONDANSETRON HCL 4 MG PO TABS: 4.0000 mg | ORAL_TABLET | Freq: Four times a day (QID) | ORAL | Status: DC | PRN

## 2019-09-18 MED ORDER — OXYCODONE HCL 5 MG PO TABS
2.5000 mg | ORAL_TABLET | ORAL | Status: DC | PRN
  Administered 2019-09-18 – 2019-09-23 (×3): 5 mg via ORAL
  Filled 2019-09-18 (×4): qty 1

## 2019-09-18 MED ORDER — ACETAMINOPHEN 500 MG PO TABS
1000.0000 mg | ORAL_TABLET | Freq: Three times a day (TID) | ORAL | Status: AC
  Administered 2019-09-18 – 2019-09-19 (×3): 1000 mg via ORAL
  Filled 2019-09-18 (×3): qty 2

## 2019-09-18 MED ORDER — PROPOFOL 10 MG/ML IV BOLUS
INTRAVENOUS | Status: DC | PRN
  Administered 2019-09-18: 20 mg via INTRAVENOUS

## 2019-09-18 MED ORDER — SODIUM CHLORIDE 0.9 % IR SOLN
Status: DC | PRN
  Administered 2019-09-18: 1000 mL

## 2019-09-18 MED ORDER — CEFAZOLIN SODIUM-DEXTROSE 1-4 GM/50ML-% IV SOLN
INTRAVENOUS | Status: AC
  Filled 2019-09-18: qty 50

## 2019-09-18 MED ORDER — ACETAMINOPHEN 650 MG RE SUPP: 650.0000 mg | Freq: Four times a day (QID) | RECTAL | Status: DC | PRN

## 2019-09-18 MED ORDER — HYDROMORPHONE HCL 1 MG/ML IJ SOLN
0.2000 mg | INTRAMUSCULAR | Status: DC | PRN
  Administered 2019-09-18: 21:00:00 0.2 mg via INTRAVENOUS
  Filled 2019-09-18: qty 1

## 2019-09-18 MED ORDER — ONDANSETRON HCL 4 MG/2ML IJ SOLN
INTRAMUSCULAR | Status: AC
  Filled 2019-09-18: qty 2

## 2019-09-18 MED ORDER — CLONAZEPAM 0.5 MG PO TABS
0.5000 mg | ORAL_TABLET | Freq: Two times a day (BID) | ORAL | Status: DC | PRN
  Administered 2019-09-18 – 2019-09-19 (×2): 0.5 mg via ORAL
  Filled 2019-09-18 (×2): qty 1

## 2019-09-18 MED ORDER — BISACODYL 10 MG RE SUPP
10.0000 mg | Freq: Every day | RECTAL | Status: DC | PRN
  Administered 2019-09-21: 03:00:00 10 mg via RECTAL
  Filled 2019-09-18: qty 1

## 2019-09-18 MED ORDER — INFLUENZA VAC A&B SA ADJ QUAD 0.5 ML IM PRSY
0.5000 mL | PREFILLED_SYRINGE | INTRAMUSCULAR | Status: DC | PRN
  Filled 2019-09-18: qty 0.5

## 2019-09-18 MED ORDER — PROPOFOL 500 MG/50ML IV EMUL
INTRAVENOUS | Status: AC
  Filled 2019-09-18: qty 50

## 2019-09-18 MED ORDER — SODIUM CHLORIDE 0.9 % IV SOLN
INTRAVENOUS | Status: DC
  Administered 2019-09-18 – 2019-09-19 (×3): via INTRAVENOUS

## 2019-09-18 MED ORDER — CHLORHEXIDINE GLUCONATE CLOTH 2 % EX PADS
6.0000 | MEDICATED_PAD | Freq: Every day | CUTANEOUS | Status: DC
  Administered 2019-09-18: 6 via TOPICAL

## 2019-09-18 MED ORDER — DEXAMETHASONE SODIUM PHOSPHATE 10 MG/ML IJ SOLN
INTRAMUSCULAR | Status: DC | PRN
  Administered 2019-09-18: 10 mg via INTRAVENOUS

## 2019-09-18 MED ORDER — CEFAZOLIN SODIUM-DEXTROSE 1-4 GM/50ML-% IV SOLN
1.0000 g | Freq: Four times a day (QID) | INTRAVENOUS | Status: AC
  Administered 2019-09-18 (×2): 1 g via INTRAVENOUS
  Filled 2019-09-18 (×2): qty 50

## 2019-09-18 MED ORDER — PROPOFOL 500 MG/50ML IV EMUL
INTRAVENOUS | Status: DC | PRN
  Administered 2019-09-18: 30 ug/kg/min via INTRAVENOUS

## 2019-09-18 MED ORDER — METOCLOPRAMIDE HCL 5 MG/ML IJ SOLN: 5.0000 mg | Freq: Three times a day (TID) | INTRAMUSCULAR | Status: DC | PRN

## 2019-09-18 MED ORDER — METOCLOPRAMIDE HCL 10 MG PO TABS: 5.0000 mg | ORAL_TABLET | Freq: Three times a day (TID) | ORAL | Status: DC | PRN

## 2019-09-18 MED ORDER — AMLODIPINE BESYLATE 5 MG PO TABS
2.5000 mg | ORAL_TABLET | Freq: Every day | ORAL | Status: DC
  Administered 2019-09-19 – 2019-09-23 (×5): 2.5 mg via ORAL
  Filled 2019-09-18 (×5): qty 1

## 2019-09-18 MED ORDER — SODIUM CHLORIDE 0.9 % IV SOLN
INTRAVENOUS | Status: DC | PRN
  Administered 2019-09-18: 12:00:00 40 ug/min via INTRAVENOUS

## 2019-09-18 MED ORDER — BUPIVACAINE HCL (PF) 0.5 % IJ SOLN
INTRAMUSCULAR | Status: AC
  Filled 2019-09-18: qty 10

## 2019-09-18 MED ORDER — TRANEXAMIC ACID-NACL 1000-0.7 MG/100ML-% IV SOLN
INTRAVENOUS | Status: DC | PRN
  Administered 2019-09-18: 1000 mg via INTRAVENOUS

## 2019-09-18 MED ORDER — PHENOL 1.4 % MT LIQD
1.0000 | OROMUCOSAL | Status: DC | PRN
  Filled 2019-09-18: qty 177

## 2019-09-18 MED ORDER — ASPIRIN EC 81 MG PO TBEC: 81.0000 mg | DELAYED_RELEASE_TABLET | Freq: Every day | ORAL | Status: DC

## 2019-09-18 MED ORDER — CLONIDINE HCL 0.1 MG PO TABS: 0.1000 mg | ORAL_TABLET | ORAL | Status: DC | PRN

## 2019-09-18 MED ORDER — FENTANYL CITRATE (PF) 100 MCG/2ML IJ SOLN: 25.0000 ug | INTRAMUSCULAR | Status: DC | PRN

## 2019-09-18 MED ORDER — PHENYLEPHRINE HCL (PRESSORS) 10 MG/ML IV SOLN
INTRAVENOUS | Status: AC
  Filled 2019-09-18: qty 1

## 2019-09-18 MED ORDER — BUSPIRONE HCL 5 MG PO TABS
5.0000 mg | ORAL_TABLET | Freq: Two times a day (BID) | ORAL | Status: DC
  Administered 2019-09-19 – 2019-09-23 (×9): 5 mg via ORAL
  Filled 2019-09-18 (×11): qty 1

## 2019-09-18 MED ORDER — EPHEDRINE SULFATE 50 MG/ML IJ SOLN
INTRAMUSCULAR | Status: AC
  Filled 2019-09-18: qty 1

## 2019-09-18 MED ORDER — DEXAMETHASONE SODIUM PHOSPHATE 10 MG/ML IJ SOLN
INTRAMUSCULAR | Status: AC
  Filled 2019-09-18: qty 1

## 2019-09-18 MED ORDER — FENTANYL CITRATE (PF) 100 MCG/2ML IJ SOLN
INTRAMUSCULAR | Status: DC | PRN
  Administered 2019-09-18: 50 ug via INTRAVENOUS

## 2019-09-18 MED ORDER — ENOXAPARIN SODIUM 40 MG/0.4ML ~~LOC~~ SOLN
40.0000 mg | SUBCUTANEOUS | Status: DC
  Administered 2019-09-19 – 2019-09-23 (×5): 40 mg via SUBCUTANEOUS
  Filled 2019-09-18 (×5): qty 0.4

## 2019-09-18 MED ORDER — SENNOSIDES-DOCUSATE SODIUM 8.6-50 MG PO TABS: 1.0000 | ORAL_TABLET | Freq: Every evening | ORAL | Status: DC | PRN

## 2019-09-18 MED ORDER — LIDOCAINE HCL (CARDIAC) PF 100 MG/5ML IV SOSY
PREFILLED_SYRINGE | INTRAVENOUS | Status: DC | PRN
  Administered 2019-09-18: 10 mg via INTRAVENOUS

## 2019-09-18 MED ORDER — ONDANSETRON HCL 4 MG/2ML IJ SOLN: 4.0000 mg | Freq: Once | INTRAMUSCULAR | Status: DC | PRN

## 2019-09-18 MED ORDER — HYDROMORPHONE HCL 1 MG/ML IJ SOLN
0.2000 mg | INTRAMUSCULAR | Status: DC | PRN
  Administered 2019-09-18: 22:00:00 0.2 mg via INTRAVENOUS
  Filled 2019-09-18: qty 1

## 2019-09-18 MED ORDER — TRAZODONE HCL 50 MG PO TABS
50.0000 mg | ORAL_TABLET | Freq: Every evening | ORAL | Status: DC | PRN
  Administered 2019-09-18: 22:00:00 50 mg via ORAL
  Filled 2019-09-18: qty 1

## 2019-09-18 MED ORDER — OXYCODONE HCL 5 MG PO TABS
5.0000 mg | ORAL_TABLET | ORAL | Status: DC | PRN
  Administered 2019-09-19 – 2019-09-20 (×2): 10 mg via ORAL
  Filled 2019-09-18 (×3): qty 2

## 2019-09-18 MED ORDER — ONDANSETRON HCL 4 MG/2ML IJ SOLN: 4.0000 mg | Freq: Four times a day (QID) | INTRAMUSCULAR | Status: DC | PRN

## 2019-09-18 MED ORDER — MORPHINE SULFATE (PF) 4 MG/ML IV SOLN: 4.0000 mg | INTRAVENOUS | Status: DC | PRN

## 2019-09-18 MED ORDER — FENTANYL CITRATE (PF) 100 MCG/2ML IJ SOLN
INTRAMUSCULAR | Status: AC
  Filled 2019-09-18: qty 2

## 2019-09-18 MED ORDER — PROPRANOLOL HCL 20 MG PO TABS
10.0000 mg | ORAL_TABLET | Freq: Three times a day (TID) | ORAL | Status: DC
  Administered 2019-09-18 – 2019-09-19 (×3): 10 mg via ORAL
  Filled 2019-09-18 (×3): qty 1

## 2019-09-18 MED ORDER — DOCUSATE SODIUM 100 MG PO CAPS
100.0000 mg | ORAL_CAPSULE | Freq: Two times a day (BID) | ORAL | Status: DC
  Administered 2019-09-19 – 2019-09-23 (×9): 100 mg via ORAL
  Filled 2019-09-18 (×10): qty 1

## 2019-09-18 MED ORDER — ACETAMINOPHEN 325 MG PO TABS: 650.0000 mg | ORAL_TABLET | Freq: Four times a day (QID) | ORAL | Status: DC | PRN

## 2019-09-18 MED ORDER — LIDOCAINE HCL (PF) 2 % IJ SOLN
INTRAMUSCULAR | Status: AC
  Filled 2019-09-18: qty 10

## 2019-09-18 MED ORDER — FAMOTIDINE 20 MG PO TABS
40.0000 mg | ORAL_TABLET | Freq: Every day | ORAL | Status: DC
  Administered 2019-09-19 – 2019-09-23 (×5): 40 mg via ORAL
  Filled 2019-09-18 (×5): qty 2

## 2019-09-18 MED ORDER — MENTHOL 3 MG MT LOZG
1.0000 | LOZENGE | OROMUCOSAL | Status: DC | PRN
  Administered 2019-09-21: 11:00:00 3 mg via ORAL
  Filled 2019-09-18 (×2): qty 9

## 2019-09-18 MED ORDER — OXYCODONE HCL 5 MG PO TABS
5.0000 mg | ORAL_TABLET | ORAL | Status: DC | PRN
  Administered 2019-09-18: 01:00:00 5 mg via ORAL
  Filled 2019-09-18: qty 1

## 2019-09-18 SURGICAL SUPPLY — 75 items
BLADE SAGITTAL AGGR TOOTH XLG (BLADE) ×2 IMPLANT
BLADE SAGITTAL WIDE XTHICK NO (BLADE) ×3 IMPLANT
BLADE SURG SZ10 CARB STEEL (BLADE) ×3 IMPLANT
BNDG COHESIVE 4X5 TAN STRL (GAUZE/BANDAGES/DRESSINGS) ×3 IMPLANT
BOWL CEMENT MIXING ADV NOZZLE (MISCELLANEOUS) ×2 IMPLANT
CANISTER SUCT 1200ML W/VALVE (MISCELLANEOUS) ×3 IMPLANT
CANISTER SUCT 3000ML PPV (MISCELLANEOUS) ×6 IMPLANT
CEMENT BONE 1-PACK (Cement) ×4 IMPLANT
CHLORAPREP W/TINT 26 (MISCELLANEOUS) ×3 IMPLANT
CNTNR SPEC 2.5X3XGRAD LEK (MISCELLANEOUS) ×1
CONT SPEC 4OZ STER OR WHT (MISCELLANEOUS) ×2
CONTAINER SPEC 2.5X3XGRAD LEK (MISCELLANEOUS) IMPLANT
COVER BACK TABLE REUSABLE LG (DRAPES) ×3 IMPLANT
COVER WAND RF STERILE (DRAPES) ×3 IMPLANT
DERMABOND ADVANCED (GAUZE/BANDAGES/DRESSINGS) ×2
DERMABOND ADVANCED .7 DNX12 (GAUZE/BANDAGES/DRESSINGS) ×1 IMPLANT
DRAPE 3/4 80X56 (DRAPES) ×9 IMPLANT
DRAPE INCISE IOBAN 66X60 STRL (DRAPES) ×3 IMPLANT
DRAPE SPLIT 6X30 W/TAPE (DRAPES) ×3 IMPLANT
DRAPE SURG 17X11 SM STRL (DRAPES) ×3 IMPLANT
DRSG OPSITE POSTOP 4X10 (GAUZE/BANDAGES/DRESSINGS) ×3 IMPLANT
DRSG OPSITE POSTOP 4X8 (GAUZE/BANDAGES/DRESSINGS) ×3 IMPLANT
ELECT BLADE 6.5 EXT (BLADE) ×3 IMPLANT
ELECT CAUTERY BLADE 6.4 (BLADE) ×3 IMPLANT
ELECT REM PT RETURN 9FT ADLT (ELECTROSURGICAL) ×3
ELECTRODE REM PT RTRN 9FT ADLT (ELECTROSURGICAL) ×1 IMPLANT
GAUZE SPONGE 4X4 12PLY STRL (GAUZE/BANDAGES/DRESSINGS) ×3 IMPLANT
GAUZE XEROFORM 1X8 LF (GAUZE/BANDAGES/DRESSINGS) ×3 IMPLANT
GLOVE BIOGEL PI IND STRL 8 (GLOVE) ×2 IMPLANT
GLOVE BIOGEL PI INDICATOR 8 (GLOVE) ×4
GLOVE SURG ORTHO 8.0 STRL STRW (GLOVE) ×6 IMPLANT
GOWN STRL REUS W/ TWL LRG LVL3 (GOWN DISPOSABLE) ×1 IMPLANT
GOWN STRL REUS W/ TWL XL LVL3 (GOWN DISPOSABLE) ×1 IMPLANT
GOWN STRL REUS W/TWL LRG LVL3 (GOWN DISPOSABLE) ×2
GOWN STRL REUS W/TWL XL LVL3 (GOWN DISPOSABLE) ×2
HEAD MODULAR ENDO (Orthopedic Implant) ×2 IMPLANT
HEAD UNPLR 48XMDLR STRL HIP (Orthopedic Implant) IMPLANT
HEMOVAC 400ML (MISCELLANEOUS)
KIT DRAIN HEMOVAC JP 7FR 400ML (MISCELLANEOUS) IMPLANT
KIT TURNOVER KIT A (KITS) ×3 IMPLANT
NDL FILTER BLUNT 18X1 1/2 (NEEDLE) ×1 IMPLANT
NDL MAYO CATGUT SZ4 TPR NDL (NEEDLE) ×1 IMPLANT
NDL SAFETY ECLIPSE 18X1.5 (NEEDLE) ×1 IMPLANT
NEEDLE FILTER BLUNT 18X 1/2SAF (NEEDLE) ×2
NEEDLE FILTER BLUNT 18X1 1/2 (NEEDLE) ×1 IMPLANT
NEEDLE HYPO 18GX1.5 SHARP (NEEDLE) ×2
NEEDLE HYPO 22GX1.5 SAFETY (NEEDLE) ×2 IMPLANT
NEEDLE MAYO CATGUT SZ4 (NEEDLE) ×3 IMPLANT
NS IRRIG 1000ML POUR BTL (IV SOLUTION) ×3 IMPLANT
PACK HIP PROSTHESIS (MISCELLANEOUS) ×3 IMPLANT
PENCIL SMOKE ULTRAEVAC 22 CON (MISCELLANEOUS) ×2 IMPLANT
PILLOW ABDUCTION FOAM SM (MISCELLANEOUS) ×3 IMPLANT
PULSAVAC PLUS IRRIG FAN TIP (DISPOSABLE) ×3
RESTRICTOR CEMENT PE SZ 2 (Cement) ×2 IMPLANT
RETRIEVER SUT HEWSON (MISCELLANEOUS) IMPLANT
SLEEVE UNITRAX V40 STD (Orthopedic Implant) ×2 IMPLANT
SOL .9 NS 3000ML IRR  AL (IV SOLUTION) ×2
SOL .9 NS 3000ML IRR UROMATIC (IV SOLUTION) ×1 IMPLANT
SPACER OSTEO CEMENT (Orthopedic Implant) ×2 IMPLANT
SPACER OSTEO CEMENT 12HIP (Orthopedic Implant) IMPLANT
STAPLER SKIN PROX 35W (STAPLE) ×3 IMPLANT
STEM FEM CMT 43X131X35 SZ3 (Stem) ×2 IMPLANT
SUT ETHIBOND #5 BRAIDED 30INL (SUTURE) ×3 IMPLANT
SUT MNCRL 4-0 (SUTURE) ×2
SUT MNCRL 4-0 27XMFL (SUTURE) ×1
SUT VIC AB 0 CT1 36 (SUTURE) ×5 IMPLANT
SUT VIC AB 2-0 CT2 27 (SUTURE) ×8 IMPLANT
SUTURE MNCRL 4-0 27XMF (SUTURE) ×1 IMPLANT
SYR 20ML LL LF (SYRINGE) ×3 IMPLANT
SYR 50ML LL SCALE MARK (SYRINGE) ×3 IMPLANT
TAPE MICROFOAM 4IN (TAPE) ×3 IMPLANT
TAPE TRANSPORE STRL 2 31045 (GAUZE/BANDAGES/DRESSINGS) ×3 IMPLANT
TIP BRUSH PULSAVAC PLUS 24.33 (MISCELLANEOUS) ×3 IMPLANT
TIP FAN IRRIG PULSAVAC PLUS (DISPOSABLE) ×1 IMPLANT
TUBE SUCT KAM VAC (TUBING) ×3 IMPLANT

## 2019-09-18 NOTE — Progress Notes (Signed)
[facility] at [place] NAME: Alexis Jackson    MR#:  [id number]  DATE OF BIRTH:  1927-12-16  SUBJECTIVE:   Patient here after a fall and noted to have a left hip fracture.  Cleared by cardiology and status post left hip hemiarthroplasty.  REVIEW OF SYSTEMS:    Review of Systems  Constitutional: Negative for chills and fever.  HENT: Negative for congestion and tinnitus.   Eyes: Negative for blurred vision and double vision.  Respiratory: Negative for cough, shortness of breath and wheezing.   Cardiovascular: Negative for chest pain, orthopnea and PND.  Gastrointestinal: Negative for abdominal pain, diarrhea, nausea and vomiting.  Genitourinary: Negative for dysuria and hematuria.  Musculoskeletal: Positive for falls and joint pain (Left Hip. ).  Neurological: Negative for dizziness, sensory change and focal weakness.  All other systems reviewed and are negative.   Nutrition: Clear liquids and advance as tolerated.  Tolerating Diet: Yes Tolerating PT: Await Eval.   DRUG ALLERGIES:   Allergies  Allergen Reactions  . Sulfa Antibiotics Rash    VITALS:  Blood pressure (!) 102/52, pulse 64, temperature 98.6 F (37 C), resp. rate 17, height 5\' 7"  (1.702 m), weight 59 kg, SpO2 97 %.  PHYSICAL EXAMINATION:   Physical Exam  GENERAL:  85 y.o.-year-old patient lying in bed in no acute distress.  EYES: Pupils equal, round, reactive to light and accommodation. No scleral icterus. Extraocular muscles intact.  HEENT: Head atraumatic, normocephalic. Oropharynx and nasopharynx clear.  NECK:  Supple, no jugular venous distention. No thyroid enlargement, no tenderness.  LUNGS: Normal breath sounds bilaterally, no wheezing, rales, rhonchi. No use of accessory muscles of respiration.  CARDIOVASCULAR: S1, S2 normal. No murmurs, rubs, or gallops.  ABDOMEN: Soft, nontender, nondistended. Bowel sounds present. No organomegaly or mass.  EXTREMITIES:  No cyanosis, clubbing, LLE ext. Rotated and shortened due to hip fracture.     NEUROLOGIC: Cranial nerves II through XII are intact. No focal Motor or sensory deficits b/l. Globally weak with mild tremor.   PSYCHIATRIC: The patient is alert and oriented x 3.  SKIN: No obvious rash, lesion, or ulcer.    LABORATORY PANEL:   CBC Recent Labs  Lab 09/18/19 0444  WBC 15.2*  HGB 11.1*  HCT 33.4*  PLT 195   ------------------------------------------------------------------------------------------------------------------  Chemistries  Recent Labs  Lab 09/18/19 0444  NA 134*  K 3.9  CL 100  CO2 25  GLUCOSE 132*  BUN 26*  CREATININE 0.93  CALCIUM 9.1   ------------------------------------------------------------------------------------------------------------------  Cardiac Enzymes No results for input(s): TROPONINI in the last 168 hours. ------------------------------------------------------------------------------------------------------------------  RADIOLOGY:  Dg Pelvis 1-2 Views  Result Date: 09/18/2019 CLINICAL DATA:  Hemiarthroplasty. EXAM: PELVIS - 1-2 VIEW COMPARISON:  CT 09/17/2019. FINDINGS: Left hip left hip hemiarthroplasty. Hardware intact. Anatomic alignment. Hemostats noted over the right pelvis. Peripheral vascular calcification noted. IMPRESSION: Left hip hemiarthroplasty with anatomic alignment. Electronically Signed   By: Marcello Moores  Register   On: 09/18/2019 13:05   Dg Knee 1-2 Views Left  Result Date: 09/17/2019 CLINICAL DATA:  Left leg pain secondary to a fall. EXAM: LEFT KNEE - 1-2 VIEW COMPARISON:  None. FINDINGS: There is no fracture or dislocation or joint effusion. The components of the left total knee prosthesis appear in good position with no loosening. Diffuse osteopenia. IMPRESSION: No acute abnormality. The components of the left total knee prosthesis are in good position with no loosening. Electronically Signed   By: Lorriane Shire M.D.  On: 09/17/2019  20:53   Ct Hip Left Wo Contrast  Result Date: 09/17/2019 CLINICAL DATA:  Left hip fracture secondary to a fall at home. EXAM: CT OF THE LEFT HIP WITHOUT CONTRAST TECHNIQUE: Multidetector CT imaging of the left hip was performed according to the standard protocol. Multiplanar CT image reconstructions were also generated. COMPARISON:  Radiographs dated 09/17/2019 FINDINGS: Bones/Joint/Cartilage There is a slightly comminuted angulated overriding benign-appearing fracture of the left femoral neck. Muscles and Tendons Normal. Soft tissues Prominent contusion of the subcutaneous fat lateral to the left greater trochanter. IMPRESSION: 1. Slightly comminuted angulated overriding benign-appearing fracture of the left femoral neck. 2. Prominent contusion of the subcutaneous fat lateral to the left greater trochanter. Electronically Signed   By: Lorriane Shire M.D.   On: 09/17/2019 21:57   Dg Chest Port 1 View  Result Date: 09/17/2019 CLINICAL DATA:  Pre operative respiratory exam. EXAM: PORTABLE CHEST 1 VIEW COMPARISON:  None FINDINGS: Mild cardiomegaly. Aortic atherosclerosis. CABG. No infiltrates or effusions. No acute bone abnormality. IMPRESSION: 1. Mild cardiomegaly. 2. Aortic atherosclerosis. 3. No acute abnormalities. Electronically Signed   By: Lorriane Shire M.D.   On: 09/17/2019 21:50   Dg Hip Unilat With Pelvis 2-3 Views Left  Result Date: 09/17/2019 CLINICAL DATA:  Left leg pain after a fall. EXAM: DG HIP (WITH OR WITHOUT PELVIS) 2-3V LEFT COMPARISON:  None. FINDINGS: There is an angulated left femoral neck fracture. No dislocation of the femoral head. Diffuse osteopenia. Aortic atherosclerosis. IMPRESSION: 1. Left femoral neck fracture. No dislocation of the femoral head. 2. Aortic atherosclerosis. Electronically Signed   By: Lorriane Shire M.D.   On: 09/17/2019 20:52     ASSESSMENT AND PLAN:   85 year old female with past medical history of dementia, pulmonary fibrosis, hypertension  hyperlipidemia, GERD, status post bypass who presented to the hospital after mechanical fall noted to have a left hip fracture.  1.  Status post fall and left hip fracture- patient is seen by orthopedics and also cleared by cardiology and is now status post left hip hemiarthroplasty. -Continue pain control as per orthopedics. -Continue Lovenox for DVT prophylaxis.  Patient likely need short-term rehab upon discharge.  2. Essential HTN - BP stable.  - cont. Clonidine, Propranolol.  - will resume Losartan, HCTZ in a.m.   3. Anxiety - cont. Klonopin/Buspar.   4. Hyperlipidemia - cont. Pravachol  5. Neuropathy - cont. Neurontin.     All the records are reviewed and case discussed with Care Management/Social Worker. Management plans discussed with the patient, family and they are in agreement.  CODE STATUS: Full code   DVT Prophylaxis: Lovenox  TOTAL TIME TAKING CARE OF THIS PATIENT: 30 minutes.   POSSIBLE D/C IN 2-3 DAYS, DEPENDING ON CLINICAL CONDITION.   Henreitta Leber M.D on 09/18/2019 at 3:23 PM  Between 7am to 6pm - Pager - [phone]  After 6pm go to [email or web address] - [facility] [employer] [facility]  Office  [phone]  CC: Primary care physician; Ezequiel Kayser, MD

## 2019-09-18 NOTE — Op Note (Signed)
DATE OF SURGERY: 09/18/2019  PREOPERATIVE DIAGNOSIS: Left femoral neck fracture  POSTOPERATIVE DIAGNOSIS: Left femoral neck fracture  PROCEDURE: Left hip hemiarthroplasty  SURGEON: Cato Mulligan, MD  EBL: 100 cc  COMPONENTS:  [employer]ker - Accolade C Size 3 Stem Stryker - Unitrax 77mm head with neutral neck Stryker - 64mm Omnifit Distal Cement Spacer Depuy - Size 2 Cement Restrictor   INDICATIONS: Alexis Jackson is a 85 y.o. female who sustained a displaced femoral neck fracture after a fall. Risks and benefits of hip hemiarthroplasty were explained to the patient and/or family. Risks include but are not limited to bleeding, infection, injury to tissues, nerves, vessels, periprosthetic infection, dislocation, limb length discrepancy and risks of anesthesia. The patient and/or family understands these risks, has completed an informed consent and wishes to proceed.   PROCEDURE:  The patient was identified in the preoperative holding area and the operative extremity was marked.  The patient was then transferred to the operating room suite and mobilized from the hospital gurney to the operating room table. Spinal anesthesia was administered without complication. The patient was then transitioned to a lateral position.  All bony prominences were padded per protocol.  An axillary roll was placed.  Careful attention was paid to the contralateral side peroneal nerve, which was free from pressure with use of appropriate padding and blankets. A time-out was performed to confirm the patient's identity and the correct laterality of surgery. The patient was then prepped and draped in the usual sterile fashion. Appropriate pre-operative antibiotics were administered. Tranexamic acid was administered preoperatively.    An incision that centered on the posterior tip of the greater trochanter with a posterior curve was made. Dissection was carried down through the subcutaneous tissue.  Careful attention was made  to maintain hemostasis using electrocautery.  Dissection brought [place] to the level of the deep fascia where the gluteus maximus muscle and proximal portion of the IT band were identified.  The proximal region of the IT band was incised in linear fashion and this incision was extended proximally in a curvilinear fashion to split the gluteus maximus muscle parallel to its fibers to minimize bleeding.  This was accomplished using a combination bovie electrocautery as well as blunt dissection.  The trochanteric bursa was then visualized and dissected from anterior to posterior. A blunt homan retractor was placed beneath the abductors. The piriformis tendon and short external rotators were visualized. Bovie electrocautery was used to cut these with the capsule as one L-shaped flap. This was tagged at the corner with #5 Ethibond. At this point, the femoral neck fracture was visualized. An oscillating saw was used to make a new neck cut approximately 17mm about the lesser tuberosity with the use of a neck cut guide. The head was then freed from its remaining soft tissue attachments and measured. The head trial was then inserted into the acetabulum and sized appropriately.    We then turned our attention to preparing the femoral canal. First, a box cut was performed utilizing the box osteotome. A canal finder was inserted by hand and sequential broaching was then performed. The calcar planer was inserted onto the broach and used to smooth the calcar appropriately.  A trial stem, neutral neck, and head were inserted into the acetabulum and placed through range of motion. Intraoperative radiographs were taken to assess hardware position and leg lengths. The hip was again dislocated and the femoral trial components were removed. An appropriately sized cement restrictor was placed. The canal was irrigated with  pulse lavage and dried. A cement gun was used to place cement into the femoral canal. Cement was then pressurized. The  actual stem was inserted into the femoral canal and then driven onto the calcar. Position was held until the cement hardened. The trial head was then again inserted on the femoral component and found to be appropriate. Trial was removed and the permanent head/neck was Morse tapered onto the femoral stem and then reduced into the acetabulum.    The hip stability and length were reassessed and found to be satisfactory.  The wound was then copiously irrigated with normal saline solution. The tagged sutures of the capsule and piriformis were sewn to the gluteus medius tendon. This adequately closed the hip capsule. The IT band and gluteus maximus fascia were then closed with 0-Vicryl in a running, locked fashion. A mixture of Exparil and Sensoricaine was administered.  The subdermal layer was closed with 2-0 Vicryl in a buried interrupted fashion. Skin was approximated with staples. Honeycomb dressing was applied.  An abduction pillow was placed. The patient was mobilized from the lateral position back to supine on the operating room table and then awakened without complication.   POSTOPERATIVE PLAN: The patient will be WBAT on operative extremity. Lovenox 40mg /day x 4 weeks to start on POD#1. Ancef x 24 hours. PT/OT on POD#1. Posterior hip precautions.

## 2019-09-18 NOTE — Transfer of Care (Signed)
Immediate Anesthesia Transfer of Care Note  Patient: Alexis Jackson  Procedure(s) Performed: ARTHROPLASTY BIPOLAR HIP (HEMIARTHROPLASTY) LEFT (Left Hip)  Patient Location: PACU  Anesthesia Type:Spinal  Level of Consciousness: drowsy  Airway & Oxygen Therapy: Patient Spontanous Breathing and Patient connected to face mask oxygen  Post-op Assessment: Report given to RN and Post -op Vital signs reviewed and stable  Post vital signs: Reviewed and stable  Last Vitals:  Vitals Value Taken Time  BP 91/49 09/18/19 1356  Temp    Pulse 55 09/18/19 1358  Resp 15 09/18/19 1358  SpO2 98 % 09/18/19 1358  Vitals shown include unvalidated device data.  Last Pain:  Vitals:   09/18/19 1056  TempSrc: Temporal  PainSc: 0-No pain      Patients Stated Pain Goal: 2 ([id number])  Complications: No apparent anesthesia complications

## 2019-09-18 NOTE — H&P (Signed)
DELAYED ENTRY.  H&P reviewed. No significant changes noted.

## 2019-09-18 NOTE — Consult Note (Signed)
ORTHOPAEDIC CONSULTATION  REQUESTING PHYSICIAN: Henreitta Leber, MD  Chief Complaint:   L hip pain  History of Present Illness: Alexis Jackson is a 85 y.o. female who had a fall after tripping on her shoes yesterday. The patient noted immediate hip pain and inability to ambulate. The patient ambulates with a walker at baseline.  Pain is described as sharp at its worst and a dull ache at its best.  Pain is rated a 10 out of 10 in severity.  Pain is improved with rest and immobilization.  Pain is worse with any sort of movement.  X-rays in the emergency department show a left femoral neck fracture.  She lives at home with her son and normally ambulates with a cane.  She has a history of a remote bypass surgery approximately 15 years ago and has been stable without any major issues recently.  She is not on any anticoagulation other than baby aspirin.  Past Medical History:  Diagnosis Date  . Benign essential tremor   . CAD (coronary artery disease)   . Dementia ([place])   . GAD (generalized anxiety disorder)   . GERD (gastroesophageal reflux disease)   . HLD (hyperlipidemia)   . Hypertension   . PAD (peripheral artery disease) ([place])   . Pulmonary fibrosis ([place])    Past Surgical History:  Procedure Laterality Date  . ABDOMINAL HYSTERECTOMY    . CHOLECYSTECTOMY    . CORONARY ARTERY BYPASS GRAFT    . KNEE ARTHROPLASTY     Social History   Socioeconomic History  . Marital status: Single    Spouse name: Not on file  . Number of children: Not on file  . Years of education: Not on file  . Highest education level: Not on file  Occupational History  . Not on file  Social Needs  . Financial resource strain: Not on file  . Food insecurity    Worry: Not on file    Inability: Not on file  . Transportation needs    Medical: Not on file    Non-medical: Not on file  Tobacco Use  . Smoking status: Former Research scientist (life sciences)  . Smokeless  tobacco: Never Used  Substance and Sexual Activity  . Alcohol use: Never    Frequency: Never  . Drug use: Never  . Sexual activity: Not on file  Lifestyle  . Physical activity    Days per week: Not on file    Minutes per session: Not on file  . Stress: Not on file  Relationships  . Social Herbalist on phone: Not on file    Gets together: Not on file    Attends religious service: Not on file    Active member of club or organization: Not on file    Attends meetings of clubs or organizations: Not on file    Relationship status: Not on file  Other Topics Concern  . Not on file  Social History Narrative  . Not on file   Family History  Problem Relation Age of Onset  . Testicular cancer Father    Allergies  Allergen Reactions  . Sulfa Antibiotics Rash   Prior to Admission medications   Medication Sig Start Date End Date Taking? Authorizing Provider  amLODipine (NORVASC) 2.5 MG tablet Take 2.5 mg by mouth daily.  05/04/19  Yes [provider]  aspirin EC 81 MG tablet Take 81 mg by mouth 3 (three) times a week.    Yes [provider]  busPIRone (  BUSPAR) 5 MG tablet Take 5 mg by mouth 2 (two) times daily.  07/09/19  Yes [provider]  clonazePAM (KLONOPIN) 0.5 MG tablet Take 0.5 mg by mouth at bedtime as needed for anxiety.    Yes [provider]  cloNIDine (CATAPRES) 0.1 MG tablet Take 0.1 mg by mouth as needed (BP >170).    Yes [provider]  famotidine (PEPCID) 40 MG tablet Take 40 mg by mouth at bedtime.    Yes [provider]  gabapentin (NEURONTIN) 100 MG capsule Take 200 mg by mouth 3 (three) times daily.  02/03/19  Yes [provider]  losartan-hydrochlorothiazide (HYZAAR) 100-12.5 MG tablet Take 1 tablet by mouth daily.  05/20/19  Yes [provider]  ondansetron (ZOFRAN-ODT) 4 MG disintegrating tablet Take 4 mg by mouth daily as needed for nausea. 07/17/19  Yes [provider]   pravastatin (PRAVACHOL) 20 MG tablet Take 20 mg by mouth every evening. 08/24/19  Yes [provider]  propranolol (INDERAL) 10 MG tablet Take 10 mg by mouth 3 (three) times daily.  05/15/19  Yes [provider]   Recent Labs    09/17/19 2152 09/18/19 0444  WBC 13.6* 15.2*  HGB 11.3* 11.1*  HCT 34.0* 33.4*  PLT 205 195  K 3.9 3.9  CL 102 100  CO2 22 25  BUN 28* 26*  CREATININE 1.07* 0.93  GLUCOSE 131* 132*  CALCIUM 9.4 9.1  INR 1.1  --    Dg Knee 1-2 Views Left  Result Date: 09/17/2019 CLINICAL DATA:  Left leg pain secondary to a fall. EXAM: LEFT KNEE - 1-2 VIEW COMPARISON:  None. FINDINGS: There is no fracture or dislocation or joint effusion. The components of the left total knee prosthesis appear in good position with no loosening. Diffuse osteopenia. IMPRESSION: No acute abnormality. The components of the left total knee prosthesis are in good position with no loosening. Electronically Signed   By: Lorriane Shire M.D.   On: 09/17/2019 20:53   Ct Hip Left Wo Contrast  Result Date: 09/17/2019 CLINICAL DATA:  Left hip fracture secondary to a fall at home. EXAM: CT OF THE LEFT HIP WITHOUT CONTRAST TECHNIQUE: Multidetector CT imaging of the left hip was performed according to the standard protocol. Multiplanar CT image reconstructions were also generated. COMPARISON:  Radiographs dated 09/17/2019 FINDINGS: Bones/Joint/Cartilage There is a slightly comminuted angulated overriding benign-appearing fracture of the left femoral neck. Muscles and Tendons Normal. Soft tissues Prominent contusion of the subcutaneous fat lateral to the left greater trochanter. IMPRESSION: 1. Slightly comminuted angulated overriding benign-appearing fracture of the left femoral neck. 2. Prominent contusion of the subcutaneous fat lateral to the left greater trochanter. Electronically Signed   By: Lorriane Shire M.D.   On: 09/17/2019 21:57   Dg Chest Port 1 View  Result Date: 09/17/2019 CLINICAL  DATA:  Pre operative respiratory exam. EXAM: PORTABLE CHEST 1 VIEW COMPARISON:  None FINDINGS: Mild cardiomegaly. Aortic atherosclerosis. CABG. No infiltrates or effusions. No acute bone abnormality. IMPRESSION: 1. Mild cardiomegaly. 2. Aortic atherosclerosis. 3. No acute abnormalities. Electronically Signed   By: Lorriane Shire M.D.   On: 09/17/2019 21:50   Dg Hip Unilat With Pelvis 2-3 Views Left  Result Date: 09/17/2019 CLINICAL DATA:  Left leg pain after a fall. EXAM: DG HIP (WITH OR WITHOUT PELVIS) 2-3V LEFT COMPARISON:  None. FINDINGS: There is an angulated left femoral neck fracture. No dislocation of the femoral head. Diffuse osteopenia. Aortic atherosclerosis. IMPRESSION: 1. Left femoral neck  fracture. No dislocation of the femoral head. 2. Aortic atherosclerosis. Electronically Signed   By: Lorriane Shire M.D.   On: 09/17/2019 20:52     Positive ROS: All other systems have been reviewed and were otherwise negative with the exception of those mentioned in the HPI and as above.  Physical Exam: BP (!) 182/68   Pulse 64   Temp 98.8 F (37.1 C) (Temporal)   Resp 14   Ht 5\' 7"  (1.702 m)   Wt 59 kg   SpO2 96%   BMI 20.36 kg/m  General:  Alert, no acute distress Psychiatric:  Patient is competent for consent with normal mood and affect   Cardiovascular:  No pedal edema, regular rate and rhythm Respiratory:  No wheezing, non-labored breathing GI:  Abdomen is soft and non-tender Skin:  No lesions in the area of chief complaint, no erythema Neurologic:  Sensation intact distally, CN grossly intact Lymphatic:  No axillary or cervical lymphadenopathy  Orthopedic Exam:  LLE: + DF/PF/EHL SILT grossly over foot Foot wwp +Log roll/axial load   X-rays:  As above: L displaced femoral neck fracture  Assessment/Plan: Alexis Jackson is a 85 y.o. female with a L displaced femoral neck fracture   1. I discussed the various treatment options including both surgical and non-surgical  management of her fracture with the patient's family (medical PoA). We discussed the high risk of perioperative complications due to patient's age and other co-morbidities. After discussion of risks, benefits, and alternatives to surgery, the family and patient were in agreement to proceed with surgery. The goals of surgery would be to provide adequate pain relief and allow for mobilization. Plan for surgery is left hip hemiarthroplasty later today.   2. NPO until OR 3. Hold anticoagulation until after surgery 4.  Patient was evaluated by Dr. Ubaldo Glassing from cardiology, and the patient was deemed medically optimized to proceed with surgery.    Leim Fabry   09/18/2019 11:08 AM

## 2019-09-18 NOTE — Anesthesia Post-op Follow-up Note (Signed)
Anesthesia QCDR form completed.        

## 2019-09-18 NOTE — Anesthesia Procedure Notes (Signed)
Spinal  Patient location during procedure: OR Start time: 09/18/2019 11:18 AM End time: 09/18/2019 11:26 AM Staffing Anesthesiologist: Emmie Niemann, MD Resident/CRNA: Eben Burow, CRNA Performed: resident/CRNA  Preanesthetic Checklist Completed: patient identified, site marked, surgical consent, pre-op evaluation, timeout performed, IV checked, risks and benefits discussed and monitors and equipment checked Spinal Block Patient position: right lateral decubitus Prep: ChloraPrep Patient monitoring: heart rate, cardiac monitor, continuous pulse ox and blood pressure Approach: midline Location: L3-4 Injection technique: single-shot Needle Needle type: Introducer and Pencil-Tip  Needle gauge: 24 G Needle length: 9 cm Assessment Sensory level: T4 Additional Notes Free flowing CSF, no parasthesias

## 2019-09-18 NOTE — Consult Note (Signed)
Cardiology Consultation Note    Patient ID: Alexis Jackson, MRN: [id number], DOB/AGE: 1927/07/13 85 y.o. Admit date: 09/17/2019   Date of Consult: 09/18/2019 Primary Physician: Alexis Kayser, MD Primary Cardiologist: None  Chief Complaint: Hip pain Reason for Consultation: preop evaluation Requesting MD: Dr. Verdell Carmine  HPI: Alexis Jackson is a 85 y.o. female with history of coronary artery disease status post coronary bypass grafting, history of dementia, hypertension, hyperlipidemia who was admitted after presenting to the emergency room after a fall at home.  She was noted to have a left hip fracture.  EKG revealed sinus rhythm with incomplete right bundle branch block.  There were no ischemic changes.  Chest x-ray revealed mild cardiomegaly with no acute abnormalities.  BUN and creatinine were 26 and 0.93 this morning.  Potassium 3.9.  Hemoglobin 11.1 with a white count of 15.  Patient is a very difficult historian secondary to memory disorder.  She states she had a bypass grafting in [facility] although details from this are not available.  She denies chest pain.  Past Medical History:  Diagnosis Date  . Benign essential tremor   . CAD (coronary artery disease)   . Dementia ([place])   . GAD (generalized anxiety disorder)   . GERD (gastroesophageal reflux disease)   . HLD (hyperlipidemia)   . Hypertension   . PAD (peripheral artery disease) ([place])   . Pulmonary fibrosis [facility])       Surgical History:  Past Surgical History:  Procedure Laterality Date  . ABDOMINAL HYSTERECTOMY    . CHOLECYSTECTOMY    . CORONARY ARTERY BYPASS GRAFT    . KNEE ARTHROPLASTY       Home Meds: Prior to Admission medications   Medication Sig Start Date End Date Taking? Authorizing Provider  amLODipine (NORVASC) 2.5 MG tablet Take 2.5 mg by mouth daily.  05/04/19  Yes [provider]  aspirin EC 81 MG tablet Take 81 mg by mouth daily.   Yes [provider]  busPIRone (BUSPAR) 5 MG  tablet Take 5 mg by mouth 2 (two) times daily.  07/09/19  Yes [provider]  clonazePAM (KLONOPIN) 0.5 MG tablet Take 0.5 mg by mouth at bedtime as needed for anxiety.    Yes [provider]  famotidine (PEPCID) 40 MG tablet Take 40 mg by mouth at bedtime.    Yes [provider]  gabapentin (NEURONTIN) 100 MG capsule Take 200 mg by mouth 3 (three) times daily.  02/03/19  Yes [provider]  losartan-hydrochlorothiazide (HYZAAR) 100-12.5 MG tablet Take 1 tablet by mouth daily.  05/20/19  Yes [provider]  pravastatin (PRAVACHOL) 20 MG tablet Take 20 mg by mouth every evening. 08/24/19  Yes [provider]  propranolol (INDERAL) 10 MG tablet Take 10 mg by mouth 3 (three) times daily.  05/15/19  Yes [provider]  cloNIDine (CATAPRES) 0.1 MG tablet Take 0.1 mg by mouth as needed (BP >170).     [provider]  ondansetron (ZOFRAN-ODT) 4 MG disintegrating tablet Take 4 mg by mouth daily as needed for nausea. 07/17/19   [provider]    Inpatient Medications:  . aspirin EC  81 mg Oral Daily  . Chlorhexidine Gluconate Cloth  6 each Topical Q0600  . famotidine  40 mg Oral Daily  . mupirocin ointment  1 application Nasal BID   .  ceFAZolin (ANCEF) IV      Allergies:  Allergies  Allergen Reactions  . Sulfa Antibiotics Rash  Social History   Socioeconomic History  . Marital status: Single    Spouse name: Not on file  . Number of children: Not on file  . Years of education: Not on file  . Highest education level: Not on file  Occupational History  . Not on file  Social Needs  . Financial resource strain: Not on file  . Food insecurity    Worry: Not on file    Inability: Not on file  . Transportation needs    Medical: Not on file    Non-medical: Not on file  Tobacco Use  . Smoking status: Former Research scientist (life sciences)  . Smokeless tobacco: Never Used  Substance and Sexual Activity  . Alcohol use: Never     Frequency: Never  . Drug use: Never  . Sexual activity: Not on file  Lifestyle  . Physical activity    Days per week: Not on file    Minutes per session: Not on file  . Stress: Not on file  Relationships  . Social Herbalist on phone: Not on file    Gets together: Not on file    Attends religious service: Not on file    Active member of club or organization: Not on file    Attends meetings of clubs or organizations: Not on file    Relationship status: Not on file  . Intimate partner violence    Fear of current or ex partner: Not on file    Emotionally abused: Not on file    Physically abused: Not on file    Forced sexual activity: Not on file  Other Topics Concern  . Not on file  Social History Narrative  . Not on file     Family History  Problem Relation Age of Onset  . Testicular cancer Father      Review of Systems: A 12-system review of systems was performed and is negative except as noted in the HPI.  Labs: No results for input(s): CKTOTAL, CKMB, TROPONINI in the last 72 hours. Lab Results  Component Value Date   WBC 15.2 (H) 09/18/2019   HGB 11.1 (L) 09/18/2019   HCT 33.4 (L) 09/18/2019   MCV 91.0 09/18/2019   PLT 195 09/18/2019    Recent Labs  Lab 09/18/19 0444  NA 134*  K 3.9  CL 100  CO2 25  BUN 26*  CREATININE 0.93  CALCIUM 9.1  GLUCOSE 132*   No results found for: CHOL, HDL, LDLCALC, TRIG No results found for: DDIMER  Radiology/Studies:  Dg Knee 1-2 Views Left  Result Date: 09/17/2019 CLINICAL DATA:  Left leg pain secondary to a fall. EXAM: LEFT KNEE - 1-2 VIEW COMPARISON:  None. FINDINGS: There is no fracture or dislocation or joint effusion. The components of the left total knee prosthesis appear in good position with no loosening. Diffuse osteopenia. IMPRESSION: No acute abnormality. The components of the left total knee prosthesis are in good position with no loosening. Electronically Signed   By: Lorriane Shire M.D.   On:  09/17/2019 20:53   Ct Hip Left Wo Contrast  Result Date: 09/17/2019 CLINICAL DATA:  Left hip fracture secondary to a fall at home. EXAM: CT OF THE LEFT HIP WITHOUT CONTRAST TECHNIQUE: Multidetector CT imaging of the left hip was performed according to the standard protocol. Multiplanar CT image reconstructions were also generated. COMPARISON:  Radiographs dated 09/17/2019 FINDINGS: Bones/Joint/Cartilage There is a slightly comminuted angulated overriding benign-appearing fracture of the left femoral neck. Muscles and  Tendons Normal. Soft tissues Prominent contusion of the subcutaneous fat lateral to the left greater trochanter. IMPRESSION: 1. Slightly comminuted angulated overriding benign-appearing fracture of the left femoral neck. 2. Prominent contusion of the subcutaneous fat lateral to the left greater trochanter. Electronically Signed   By: Lorriane Shire M.D.   On: 09/17/2019 21:57   Dg Chest Port 1 View  Result Date: 09/17/2019 CLINICAL DATA:  Pre operative respiratory exam. EXAM: PORTABLE CHEST 1 VIEW COMPARISON:  None FINDINGS: Mild cardiomegaly. Aortic atherosclerosis. CABG. No infiltrates or effusions. No acute bone abnormality. IMPRESSION: 1. Mild cardiomegaly. 2. Aortic atherosclerosis. 3. No acute abnormalities. Electronically Signed   By: Lorriane Shire M.D.   On: 09/17/2019 21:50   Dg Hip Unilat With Pelvis 2-3 Views Left  Result Date: 09/17/2019 CLINICAL DATA:  Left leg pain after a fall. EXAM: DG HIP (WITH OR WITHOUT PELVIS) 2-3V LEFT COMPARISON:  None. FINDINGS: There is an angulated left femoral neck fracture. No dislocation of the femoral head. Diffuse osteopenia. Aortic atherosclerosis. IMPRESSION: 1. Left femoral neck fracture. No dislocation of the femoral head. 2. Aortic atherosclerosis. Electronically Signed   By: Lorriane Shire M.D.   On: 09/17/2019 20:52    Wt Readings from Last 3 Encounters:  09/17/19 59 kg  07/15/19 52.2 kg    EKG: Normal sinus rhythm with  incomplete right bundle branch block and left posterior fascicular block.  No ischemia.  Physical Exam:  Blood pressure (!) 153/67, pulse 61, temperature 98 F (36.7 C), temperature source Oral, resp. rate 18, height 5\' 7"  (1.702 m), weight 59 kg, SpO2 94 %. Body mass index is 20.36 kg/m. General: Well developed, well nourished, in no acute distress. Head: Normocephalic, atraumatic, sclera non-icteric, no xanthomas, nares are without discharge.  Neck: Negative for carotid bruits. JVD not elevated. Lungs: Clear bilaterally to auscultation without wheezes, rales, or rhonchi. Breathing is unlabored. Heart: RRR with S1 S2. No murmurs, rubs, or gallops appreciated. Abdomen: Soft, non-tender, non-distended with normoactive bowel sounds. No hepatomegaly. No rebound/guarding. No obvious abdominal masses. Msk:  Strength and tone appear normal for age. Extremities: Left hip pain. Neuro: Alert and oriented X 3. No facial asymmetry. No focal deficit. Moves all extremities spontaneously. Psych:  Responds to questions appropriately with a normal affect.     Assessment and Plan  85 year old female with history of coronary disease status post coronary bypass grafting in [place] with no clear details regarding this who was admitted after fall causing a hip fracture.  She is being considered for surgical intervention.  She is in sinus rhythm with no ischemia on electrocardiogram.  She is hemodynamically stable.  She is at high risk based on age and comorbidity.  She appears to be optimized from a cardiac standpoint.  She appears to have been on amlodipine 2.5 mg daily most recently along with clonidine 0.1 as needed.  She is also per chart been on losartan/hydrochlorothiazide 100/12.5 mg daily as well as propranolol at 10 mg daily apparently for tremor.  She is relatively bradycardic at present so therefore would hold propranolol.  Would continue with amlodipine at low-dose at 2.5 mg daily along with the losartan  at 100 mg daily.  Will hold hydrochlorothiazide for now.  Would proceed with surgery.  Again she is at high risk based on age and comorbidity however appears to be optimized from a cardiac standpoint hemodynamically.  Signed, Teodoro Spray MD 09/18/2019, 7:08 AM Pager: [phone]

## 2019-09-18 NOTE — Anesthesia Preprocedure Evaluation (Signed)
Anesthesia Evaluation  Patient identified by MRN, date of birth, ID band Patient awake    Reviewed: Allergy & Precautions, NPO status , Patient's Chart, lab work & pertinent test results  History of Anesthesia Complications Negative for: history of anesthetic complications  Airway Mallampati: III  TM Distance: >3 FB Neck ROM: Full    Dental no notable dental hx.    Pulmonary neg sleep apnea, neg COPD, former smoker,    breath sounds clear to auscultation- rhonchi (-) wheezing      Cardiovascular hypertension, Pt. on medications + CAD, + CABG and + Peripheral Vascular Disease  (-) Cardiac Stents  Rhythm:Regular Rate:Normal - Systolic murmurs and - Diastolic murmurs    Neuro/Psych neg Seizures PSYCHIATRIC DISORDERS Anxiety Dementia negative neurological ROS     GI/Hepatic Neg liver ROS, GERD  ,  Endo/Other  negative endocrine ROSneg diabetes  Renal/GU negative Renal ROS     Musculoskeletal negative musculoskeletal ROS (+)   Abdominal (+) - obese,   Peds  Hematology negative hematology ROS (+)   Anesthesia Other Findings Past Medical History: No date: Benign essential tremor No date: CAD (coronary artery disease) No date: Dementia ([facility]) No date: GAD (generalized anxiety disorder) No date: GERD (gastroesophageal reflux disease) No date: HLD (hyperlipidemia) No date: Hypertension No date: PAD (peripheral artery disease) ([facility]) No date: Pulmonary fibrosis ([facility])   Reproductive/Obstetrics                             Lab Results  Component Value Date   WBC 15.2 (H) 09/18/2019   HGB 11.1 (L) 09/18/2019   HCT 33.4 (L) 09/18/2019   MCV 91.0 09/18/2019   PLT 195 09/18/2019    Anesthesia Physical Anesthesia Plan  ASA: II  Anesthesia Plan: Spinal   Post-op Pain Management:    Induction:   PONV Risk Score and Plan: 2 and Ondansetron and Treatment may vary due to age or medical  condition  Airway Management Planned: Natural Airway  Additional Equipment:   Intra-op Plan:   Post-operative Plan:   Informed Consent: I have reviewed the patients History and Physical, chart, labs and discussed the procedure including the risks, benefits and alternatives for the proposed anesthesia with the patient or authorized representative who has indicated his/her understanding and acceptance.     Dental advisory given  Plan Discussed with: CRNA and Anesthesiologist  Anesthesia Plan Comments:         Anesthesia Quick Evaluation

## 2019-09-19 LAB — BASIC METABOLIC PANEL
Anion gap: 10 (ref 5–15)
BUN: 31 mg/dL — ABNORMAL HIGH (ref 8–23)
CO2: 22 mmol/L (ref 22–32)
Calcium: 8.6 mg/dL — ABNORMAL LOW (ref 8.9–10.3)
Chloride: 102 mmol/L (ref 98–111)
Creatinine, Ser: 1.31 mg/dL — ABNORMAL HIGH (ref 0.44–1.00)
GFR calc Af Amer: 42 mL/min — ABNORMAL LOW (ref >60)
GFR calc non Af Amer: 36 mL/min — ABNORMAL LOW (ref >60)
Glucose, Bld: 139 mg/dL — ABNORMAL HIGH (ref 70–99)
Potassium: 3.9 mmol/L (ref 3.5–5.1)
Sodium: 134 mmol/L — ABNORMAL LOW (ref 135–145)

## 2019-09-19 LAB — CBC
HCT: 32.1 % — ABNORMAL LOW (ref 36.0–46.0)
Hemoglobin: 10.9 g/dL — ABNORMAL LOW (ref 12.0–15.0)
MCH: 30.5 pg (ref 26.0–34.0)
MCHC: 34 g/dL (ref 30.0–36.0)
MCV: 89.9 fL (ref 80.0–100.0)
Platelets: 205 10*3/uL (ref 150–400)
RBC: 3.57 MIL/uL — ABNORMAL LOW (ref 3.87–5.11)
RDW: 13.1 % (ref 11.5–15.5)
WBC: 18.5 10*3/uL — ABNORMAL HIGH (ref 4.0–10.5)
nRBC: 0 % (ref 0.0–0.2)

## 2019-09-19 MED ORDER — HYDROCHLOROTHIAZIDE 12.5 MG PO CAPS
12.5000 mg | ORAL_CAPSULE | Freq: Every day | ORAL | Status: DC
  Administered 2019-09-20 – 2019-09-23 (×4): 12.5 mg via ORAL
  Filled 2019-09-19 (×4): qty 1

## 2019-09-19 MED ORDER — LOSARTAN POTASSIUM 50 MG PO TABS
100.0000 mg | ORAL_TABLET | Freq: Every day | ORAL | Status: DC
  Administered 2019-09-20 – 2019-09-23 (×4): 100 mg via ORAL
  Filled 2019-09-19 (×4): qty 2

## 2019-09-19 MED ORDER — LOSARTAN POTASSIUM-HCTZ 100-12.5 MG PO TABS: 1.0000 | ORAL_TABLET | Freq: Every day | ORAL | Status: DC

## 2019-09-19 NOTE — Evaluation (Addendum)
Physical Therapy Evaluation Patient Details Name: Alexis Jackson MRN: [id number] DOB: 1927/01/14 Today's Date: 09/19/2019   History of Present Illness  Alexis Jackson is an 75yoF who comes to [facility] on 9/17 s/p fall at home, underwent HHA on Left femur to address femoral neck fracture. PMH: HTN, GAD, GERD, CAD, PAD, Dementia (no formal diagnosis per family), CBAG, Left TKA, pulmonary fibrosis. PTA pt lived with Son and AMB with minGuard and SPC short distances within the home.  Clinical Impression  Pt admitted with above diagnosis. Pt currently with functional limitations due to the deficits listed below (see "PT Problem List"). Upon entry, pt in bed, asleep, easily made awake, and agreeable to participate. BP 150s/50s, HR: 44-48bpm, RN and MD made aware. Pt remains drowsy and with flat affect throughout session, voices desire to eat untouched breakfast but falls asleep multiple times before initiating feeding. Pt tolerating AA/ROM to BLE well, no emotive changes to face, but reports pain when asked at times. MaxA to EOB and unable to sit unassisted without retropulsion. Alexis Jackson STS transfer then TotalA SPT to recliner, where pt is made comfortable, SCD'd, and set-up for meal. At this point Pt's son's girlfriend arrived and provided some history- she is regularly involved as a caregiver.  The pt is alert and oriented to self, pleasant, minimally conversational, and generally unhelpful as a historian. Pt reports she normally has trouble remembering things. Functional mobility assessment demonstrates increased effort/time requirements, poor tolerance, and need for physical assistance, whereas the patient performed these at a higher level of independence PTA. Pt ambulatory PTA, but now is totalA +2 for mobility basics. Pt would do well with STR to address acute impairment and deficits prior to returning to home. Pt will benefit from skilled PT intervention to increase independence and safety with basic mobility in  preparation for discharge to the venue listed below.       Follow Up Recommendations SNF    Equipment Recommendations  Other (comment)(to be determined by facility)    Recommendations for Other Services       Precautions / Restrictions Precautions Precautions: Posterior Hip Precaution Comments: fidgety and line-pully (had safety mittens on at entry) Restrictions Weight Bearing Restrictions: Yes LLE Weight Bearing: Weight bearing as tolerated      Mobility  Bed Mobility Overal bed mobility: Needs Assistance Bed Mobility: Supine to Sit     Supine to sit: Max assist;+2 for physical assistance     General bed mobility comments: Difficult sitting independently at EOB without support, noted retropulsion  Transfers Overall transfer level: Needs assistance Equipment used: 1 person hand held assist Transfers: Sit to/from [facility] Sit to Stand: Mod assist Stand pivot transfers: Total assist;+2 physical assistance       General transfer comment: dependent transfer performed to standing; pt not able to step with either foot, hence author assists with foot movement and wight shifting. pt remains calm and somewhat drowsy.  Ambulation/Gait Ambulation/Gait assistance: (not safe to attempt at this time.)              Stairs            Wheelchair Mobility    Modified Rankin (Stroke Patients Only)       Balance Overall balance assessment: Needs assistance Sitting-balance support: Bilateral upper extremity supported;Feet supported Sitting balance-Leahy Scale: Zero     Standing balance support: Bilateral upper extremity supported;During functional activity Standing balance-Leahy Scale: Zero  Pertinent Vitals/Pain Pain Assessment: (Pt reports pain at appropriat etimes when asked, but it does not translate to facial changes. Limits AMB)    Home Living Family/patient expects to be discharged to::  Private residence Living Arrangements: Children(Son; son's girlfriend also assists.) Available Help at Discharge: Family Type of Home: House Home Access: Stairs to enter Entrance Stairs-Rails: None Entrance Stairs-Number of Steps: 3 Home Layout: Two level;Able to live on main level with bedroom/bathroom Home Equipment: Kasandra Knudsen - single point      Prior Function Level of Independence: Needs assistance   Gait / Transfers Assistance Needed: minguard assist with [facility] for room-to-room distances; No falls history  ADL's / Homemaking Assistance Needed: Independent with ADL.        Hand Dominance        Extremity/Trunk Assessment        Lower Extremity Assessment Lower Extremity Assessment: Generalized weakness    Cervical / Trunk Assessment Cervical / Trunk Assessment: Normal  Communication      Cognition Arousal/Alertness: Lethargic;Suspect due to medications Behavior During Therapy: Flat affect Overall Cognitive Status: History of cognitive impairments - at baseline(more drowsy than baseline)                                        General Comments      Exercises Total Joint Exercises Heel Slides: AAROM;Both;20 reps;Supine;Limitations Heel Slides Limitations: pt struggling with follow simple commands for activity. Hip ABduction/ADduction: AAROM;Both;20 reps;Supine;Limitations Hip Abduction/Adduction Limitations: pt struggling with follow simple commands for activity.   Assessment/Plan    PT Assessment Patient needs continued PT services  PT Problem List Decreased strength;Decreased activity tolerance;Decreased balance;Decreased mobility;Decreased coordination;Decreased knowledge of use of DME;Decreased safety awareness;Decreased knowledge of precautions       PT Treatment Interventions DME instruction;Gait training;Functional mobility training;Stair training;Therapeutic activities;Therapeutic exercise;Patient/family education    PT Goals (Current  goals can be found in the Care Plan section)  Acute Rehab PT Goals Patient Stated Goal: recovery baseline AMB status PT Goal Formulation: With family Time For Goal Achievement: 10/03/19 Potential to Achieve Goals: Good    Frequency BID(trial BID, may need to be QD)   Barriers to discharge Inaccessible home environment;Decreased caregiver support stairs to enter; family unable to provide physical assist    Co-evaluation               AM-PAC PT "6 Clicks" Mobility  Outcome Measure Help needed turning from your back to your side while in a flat bed without using bedrails?: Total Help needed moving from lying on your back to sitting on the side of a flat bed without using bedrails?: Total Help needed moving to and from a bed to a chair (including a wheelchair)?: Total Help needed standing up from a chair using your arms (e.g., wheelchair or bedside chair)?: Total Help needed to walk in hospital room?: Total Help needed climbing 3-5 steps with a railing? : Total 6 Click Score: 6    End of Session Equipment Utilized During Treatment: Gait belt;Oxygen Activity Tolerance: Patient limited by lethargy;Other (comment)(imapired cognition at this time;) Patient left: in chair;with family/visitor present;with call bell/phone within reach;with chair alarm set;with SCD's reapplied Nurse Communication: Mobility status(informed RN and MD of HR in 40s upon entry.) PT Visit Diagnosis: Unsteadiness on feet (R26.81);Other abnormalities of gait and mobility (R26.89);Difficulty in walking, not elsewhere classified (R26.2);Muscle weakness (generalized) (M62.81);History of falling (Z91.81);Other symptoms and signs involving the  nervous system (R29.898)    Time: 1017-1100 PT Time Calculation (min) (ACUTE ONLY): 43 min   Charges:   PT Evaluation $PT Eval Moderate Complexity: 1 Mod PT Treatments $Therapeutic Exercise: 8-22 mins        1:09 PM, 09/19/19 Etta Grandchild, PT, DPT Physical  Therapist - [facility]  [phone] ([place])    Faye Strohman C 09/19/2019, 1:03 PM

## 2019-09-19 NOTE — Progress Notes (Signed)
MD aware of pulse rate in the lower 50's and upper 40's. Medication adjustment made by MD. Order rec'd to apply tele to patient. Patient is awake, arousable, was medicated this am for pain. Currently IV fluids infusing at 75cc/hr.

## 2019-09-19 NOTE — Progress Notes (Signed)
Physical Therapy Treatment Patient Details Name: Alexis Jackson MRN: [id number] DOB: 1927/10/03 Today's Date: 09/19/2019    History of Present Illness Alexis Jackson is an 47yoF who comes to [facility] on 9/17 s/p fall at home, underwent HHA on Left femur to address femoral neck fracture. PMH: HTN, GAD, GERD, CAD, PAD, Dementia (no formal diagnosis per family), CABG, Left TKA, pulmonary fibrosis. PTA pt lived with Son and AMB with minGuard and SPC short distances within the home.    PT Comments    Author returned for BID session from hallway when visualized that OT was attempting to mobilize patient and needed a +2 for safety and lift assist. Pt remains lethargic, family in room reporting pt has been essentially asleep since AM PT session. OT reports pt poorly responding to stimulus upon OT entry. Min-ModA for rising from chair, but pt still not able to taken steps independently. Pt appears more lethargic after 60sec standing for pivot to bedside. Pt returned to supine after 30 seconds of difficulty rousing. HR remains brady in 61s. RN aware. Family aware. Will continue to follow and progress as appropriate.   Follow Up Recommendations  SNF     Equipment Recommendations  Other (comment)    Recommendations for Other Services       Precautions / Restrictions Precautions Precautions: Posterior Hip Precaution Comments: has pulled lines, had safety mittens Restrictions Weight Bearing Restrictions: Yes LLE Weight Bearing: Weight bearing as tolerated    Mobility  Bed Mobility Overal bed mobility: Needs Assistance Bed Mobility: Sit to Supine     Supine to sit: Max assist;+2 for physical assistance     General bed mobility comments: In EOB sitting, requires MAX A to maintain static sitting balance, notable posterior lean  Transfers Overall transfer level: Needs assistance Equipment used: 2 person hand held assist;None Transfers: Sit to/from [facility] Sit to Stand: Min  assist Stand pivot transfers: Total assist;+2 physical assistance       General transfer comment: Pt lethargic with minmal eye opening during t/f back to bed. Dependent sit to stand.Author providing trunk support, weight shifting, and verbal cues. OT assisting with BLE movement with stepping. Pt offers minimal ability for independent stepping.  Ambulation/Gait Ambulation/Gait assistance: (not safe to attempt at this time.)               Stairs             Wheelchair Mobility    Modified Rankin (Stroke Patients Only)       Balance Overall balance assessment: Needs assistance;History of Falls Sitting-balance support: Bilateral upper extremity supported;Feet supported Sitting balance-Leahy Scale: Zero Sitting balance - Comments: s/p SPT, appears asleep, eyes closed, trunk somewhat flaccid, requires support from author   Standing balance support: Bilateral upper extremity supported;During functional activity Standing balance-Leahy Scale: Poor Standing balance comment: surprisingly steady at bedside, but essentially asleep and not following commands for return to sitting                            Cognition Arousal/Alertness: Lethargic;Suspect due to medications Behavior During Therapy: Flat affect Overall Cognitive Status: History of cognitive impairments - at baseline(family member present states that pt is more drowsy this date.)  General Comments        Pertinent Vitals/Pain Pain Assessment: (Pt with limited facial expression to indicate pain. Difficult to assess if pt is hurting, but she does appear to limit participation in mobility d/t pain.)            PT Goals (current goals can now be found in the care plan section) Acute Rehab PT Goals Patient Stated Goal: to regain stamina PT Goal Formulation: With family Time For Goal Achievement: 10/03/19 Potential to Achieve Goals:  Good Progress towards PT goals: Not progressing toward goals - comment    Frequency    BID(trial BID; QD if alertness remains limited)      PT Plan Current plan remains appropriate    Co-evaluation PT/OT/SLP Co-Evaluation/Treatment: Yes Reason for Co-Treatment: Complexity of the patient's impairments (multi-system involvement);Necessary to address cognition/behavior during functional activity;For patient/therapist safety;To address functional/ADL transfers PT goals addressed during session: Mobility/safety with mobility;Strengthening/ROM OT goals addressed during session: ADL's and self-care      AM-PAC PT "6 Clicks" Mobility   Outcome Measure  Help needed turning from your back to your side while in a flat bed without using bedrails?: Total Help needed moving from lying on your back to sitting on the side of a flat bed without using bedrails?: Total Help needed moving to and from a bed to a chair (including a wheelchair)?: Total Help needed standing up from a chair using your arms (e.g., wheelchair or bedside chair)?: Total Help needed to walk in hospital room?: Total Help needed climbing 3-5 steps with a railing? : Total 6 Click Score: 6    End of Session Equipment Utilized During Treatment: Gait belt;Oxygen Activity Tolerance: Patient limited by lethargy;Other (comment) Patient left: with family/visitor present;with call bell/phone within reach;with SCD's reapplied;in bed;Other (comment)(THA wedge in place) Nurse Communication: Mobility status PT Visit Diagnosis: Unsteadiness on feet (R26.81);Other abnormalities of gait and mobility (R26.89);Difficulty in walking, not elsewhere classified (R26.2);Muscle weakness (generalized) (M62.81);History of falling (Z91.81);Other symptoms and signs involving the nervous system (R29.898)     Time: [id number] PT Time Calculation (min) (ACUTE ONLY): 16 min  Charges:  $Therapeutic Exercise: 8-22 mins                    3:44 PM,  09/19/19 Alexis Jackson, PT, DPT Physical Therapist - [facility]  [phone] ([place])    Alexis Jackson C 09/19/2019, 3:40 PM

## 2019-09-19 NOTE — Anesthesia Postprocedure Evaluation (Signed)
Anesthesia Post Note  Patient: Alexis Jackson  Procedure(s) Performed: ARTHROPLASTY BIPOLAR HIP (HEMIARTHROPLASTY) LEFT (Left Hip)  Patient location during evaluation: Nursing Unit Anesthesia Type: Spinal Level of consciousness: oriented and awake and alert Pain management: pain level controlled Vital Signs Assessment: post-procedure vital signs reviewed and stable Respiratory status: spontaneous breathing and respiratory function stable Cardiovascular status: blood pressure returned to baseline and stable Postop Assessment: no headache, no backache, no apparent nausea or vomiting and patient able to bend at knees Anesthetic complications: no     Last Vitals:  Vitals:   09/19/19 1028 09/19/19 1157  BP: (!) 155/56 (!) 98/54  Pulse: (!) 48 (!) 43  Resp:    Temp:  36.6 C  SpO2: 100% 100%    Last Pain:  Vitals:   09/19/19 1157  TempSrc: Oral  PainSc:                  KEPHART,WILLIAM K

## 2019-09-19 NOTE — Progress Notes (Signed)
[place] [facility] [place] NAME: Alexis Jackson    MR#:  [id number]  DATE OF BIRTH:  Jan 24, 1927  SUBJECTIVE:   Patient here after a fall and noted to have a left hip fracture.  S/p Left Hip Hemiarthroplasty  POD # 1 today.   Pt has some bradycardia but BP stable.  No acute events overnight.    REVIEW OF SYSTEMS:    Review of Systems  Constitutional: Negative for chills and fever.  HENT: Negative for congestion and tinnitus.   Eyes: Negative for blurred vision and double vision.  Respiratory: Negative for cough, shortness of breath and wheezing.   Cardiovascular: Negative for chest pain, orthopnea and PND.  Gastrointestinal: Negative for abdominal pain, diarrhea, nausea and vomiting.  Genitourinary: Negative for dysuria and hematuria.  Musculoskeletal: Positive for falls and joint pain (Left Hip. ).  Neurological: Negative for dizziness, sensory change and focal weakness.  All other systems reviewed and are negative.   Nutrition: Clear liquids and advance as tolerated.  Tolerating Diet: Yes Tolerating PT: Await Eval.   DRUG ALLERGIES:   Allergies  Allergen Reactions   Sulfa Antibiotics Rash    VITALS:  Blood pressure (!) 98/54, pulse (!) 43, temperature 97.9 F (36.6 C), temperature source Oral, resp. rate 17, height 5\' 7"  (1.702 m), weight 59 kg, SpO2 100 %.  PHYSICAL EXAMINATION:   Physical Exam  GENERAL:  85 y.o.-year-old patient lying in bed in no acute distress.  EYES: Pupils equal, round, reactive to light and accommodation. No scleral icterus. Extraocular muscles intact.  HEENT: Head atraumatic, normocephalic. Oropharynx and nasopharynx clear.  NECK:  Supple, no jugular venous distention. No thyroid enlargement, no tenderness.  LUNGS: Normal breath sounds bilaterally, no wheezing, rales, rhonchi. No use of accessory muscles of respiration.  CARDIOVASCULAR: S1, S2 normal. No murmurs, rubs, or gallops.  ABDOMEN: Soft,  nontender, nondistended. Bowel sounds present. No organomegaly or mass.  EXTREMITIES: No cyanosis, clubbing, Left hip dressing in place with no acute bleeding or drainage noted.     NEUROLOGIC: Cranial nerves II through XII are intact. No focal Motor or sensory deficits b/l. Globally weak with mild tremor.   PSYCHIATRIC: The patient is alert and oriented x 2.  SKIN: No obvious rash, lesion, or ulcer.    LABORATORY PANEL:   CBC Recent Labs  Lab 09/19/19 0548  WBC 18.5*  HGB 10.9*  HCT 32.1*  PLT 205   ------------------------------------------------------------------------------------------------------------------  Chemistries  Recent Labs  Lab 09/19/19 0548  NA 134*  K 3.9  CL 102  CO2 22  GLUCOSE 139*  BUN 31*  CREATININE 1.31*  CALCIUM 8.6*   ------------------------------------------------------------------------------------------------------------------  Cardiac Enzymes No results for input(s): TROPONINI in the last 168 hours. ------------------------------------------------------------------------------------------------------------------  RADIOLOGY:  Dg Pelvis 1-2 Views  Result Date: 09/18/2019 CLINICAL DATA:  Hemiarthroplasty. EXAM: PELVIS - 1-2 VIEW COMPARISON:  CT 09/17/2019. FINDINGS: Left hip left hip hemiarthroplasty. Hardware intact. Anatomic alignment. Hemostats noted over the right pelvis. Peripheral vascular calcification noted. IMPRESSION: Left hip hemiarthroplasty with anatomic alignment. Electronically Signed   By: Marcello Moores  Register   On: 09/18/2019 13:05   Dg Knee 1-2 Views Left  Result Date: 09/17/2019 CLINICAL DATA:  Left leg pain secondary to a fall. EXAM: LEFT KNEE - 1-2 VIEW COMPARISON:  None. FINDINGS: There is no fracture or dislocation or joint effusion. The components of the left total knee prosthesis appear in good position with no loosening. Diffuse osteopenia. IMPRESSION: No acute abnormality. The  components of the left total knee  prosthesis are in good position with no loosening. Electronically Signed   By: Lorriane Shire M.D.   On: 09/17/2019 20:53   Dg Pelvis Portable  Result Date: 09/18/2019 CLINICAL DATA:  The patient suffered a left hip fracture in a fall 09/17/2019. Initial encounter. EXAM: PORTABLE PELVIS 1-2 VIEWS COMPARISON:  CT and plain films left hip 09/17/2019. FINDINGS: New bipolar left hip arthroplasty is in place. The device is located. No fracture. Gas in the soft tissues and surgical staples noted. IMPRESSION: Status post left hip replacement.  No acute abnormality. Electronically Signed   By: Inge Rise M.D.   On: 09/18/2019 16:01   Ct Hip Left Wo Contrast  Result Date: 09/17/2019 CLINICAL DATA:  Left hip fracture secondary to a fall at home. EXAM: CT OF THE LEFT HIP WITHOUT CONTRAST TECHNIQUE: Multidetector CT imaging of the left hip was performed according to the standard protocol. Multiplanar CT image reconstructions were also generated. COMPARISON:  Radiographs dated 09/17/2019 FINDINGS: Bones/Joint/Cartilage There is a slightly comminuted angulated overriding benign-appearing fracture of the left femoral neck. Muscles and Tendons Normal. Soft tissues Prominent contusion of the subcutaneous fat lateral to the left greater trochanter. IMPRESSION: 1. Slightly comminuted angulated overriding benign-appearing fracture of the left femoral neck. 2. Prominent contusion of the subcutaneous fat lateral to the left greater trochanter. Electronically Signed   By: Lorriane Shire M.D.   On: 09/17/2019 21:57   Dg Chest Port 1 View  Result Date: 09/17/2019 CLINICAL DATA:  Pre operative respiratory exam. EXAM: PORTABLE CHEST 1 VIEW COMPARISON:  None FINDINGS: Mild cardiomegaly. Aortic atherosclerosis. CABG. No infiltrates or effusions. No acute bone abnormality. IMPRESSION: 1. Mild cardiomegaly. 2. Aortic atherosclerosis. 3. No acute abnormalities. Electronically Signed   By: Lorriane Shire M.D.   On: 09/17/2019  21:50   Dg Hip Unilat With Pelvis 2-3 Views Left  Result Date: 09/17/2019 CLINICAL DATA:  Left leg pain after a fall. EXAM: DG HIP (WITH OR WITHOUT PELVIS) 2-3V LEFT COMPARISON:  None. FINDINGS: There is an angulated left femoral neck fracture. No dislocation of the femoral head. Diffuse osteopenia. Aortic atherosclerosis. IMPRESSION: 1. Left femoral neck fracture. No dislocation of the femoral head. 2. Aortic atherosclerosis. Electronically Signed   By: Lorriane Shire M.D.   On: 09/17/2019 20:52     ASSESSMENT AND PLAN:   85 year old female with past medical history of dementia, pulmonary fibrosis, hypertension hyperlipidemia, GERD, status post bypass who presented to the hospital after mechanical fall noted to have a left hip fracture.  1.  Status post fall and left hip fracture- status post left hip hemiarthroplasty postop day #1 today. -Pain is well controlled.  Continue Lovenox for DVT prophylaxis, likely will need short-term rehab upon discharge.  2. Essential HTN - BP stable.  - cont. Clonidine, will resume losartan/HCTZ.  Hold propranolol given the bradycardia.  3. Anxiety - cont. Klonopin/Buspar.   4. Hyperlipidemia - cont. Pravachol  5. Neuropathy - cont. Neurontin.     All the records are reviewed and case discussed with Care Management/Social Worker. Management plans discussed with the patient, family and they are in agreement.  CODE STATUS: Full code   DVT Prophylaxis: Lovenox  TOTAL TIME TAKING CARE OF THIS PATIENT: 25 minutes.   POSSIBLE D/C IN 2-3 DAYS, DEPENDING ON CLINICAL CONDITION.   Henreitta Leber M.D on 09/19/2019 at 12:42 PM  Between 7am to 6pm - Pager - [phone]  After 6pm go to [email or web address] - password [email or web address]  [facility]  [facility]  Office  [phone]  CC: Primary care physician; Ezequiel Kayser, MD

## 2019-09-19 NOTE — Progress Notes (Signed)
MD notified that patient has not voided this shift. Bladder scanned for 266 mls. Abdomen/bladder not distended. Patient taking PO fluids in addition to IV infusing at 75 mls /hr.

## 2019-09-19 NOTE — Evaluation (Signed)
Occupational Therapy Evaluation Patient Details Name: Alexis Jackson MRN: [id number] DOB: [birth year] Today's Date: 09/19/2019    History of Present Illness Alexis Jackson is an 47yoF who comes to [facility] on 9/17 s/p fall at home, underwent HHA on Left femur to address femoral neck fracture. PMH: HTN, GAD, GERD, CAD, PAD, Dementia (no formal diagnosis per family), CABG, Left TKA, pulmonary fibrosis. PTA pt lived with Son and AMB with minGuard and SPC short distances within the home.   Clinical Impression   Pt seen for OT evaluation this date after fall at home 9/17 with resultant femoral neck fracture and now s/p L hip hemiarthroplasty. Prior to hospital admission, pt was reportedly (per son's significant other who acts as caregiver) mostly independent with BADLs, but had constant close supervision. Pt required setup and close supervision for bathing in particular for safety. Pt was using SPC for fxl mobility for short distances within the home. Notably, family was also providing HHA on opposite side from cane.  Pt lives in two level home, but can live on main level.  Currently pt demonstrates impairments in all aspects of fxl activity participation and tolerance. Difficult to distinguish true levels as pt lethargic throughout assessment, requiring MAX A for static, unsupported sitting MOD A arm in arm (dependent) for sit to stand and MOD A x2 for SPS from chair to bed (detailed below).  In addition, pt unable to engage meaninfully in and ADL participation at this time due to minimal wakefullness. Pt would benefit from skilled OT to address noted impairments and functional limitations (see below for any additional details) in order to maximize safety and independence while minimizing falls risk and caregiver burden.  Upon hospital discharge, recommend pt discharge to SNF.    Follow Up Recommendations  SNF    Equipment Recommendations  Other (comment)(TBD, defer to next level of care.)    Recommendations  for Other Services       Precautions / Restrictions Precautions Precautions: Posterior Hip Precaution Comments: has pulled lines, had safety mittens Restrictions Weight Bearing Restrictions: Yes LLE Weight Bearing: Weight bearing as tolerated      Mobility Bed Mobility Overal bed mobility: Needs Assistance Bed Mobility: Sit to Supine     Supine to sit: Max assist;+2 for physical assistance     General bed mobility comments: In EOB sitting, requires MAX A to maintain static sitting balance, notable posterior lean  Transfers Overall transfer level: Needs assistance Equipment used: 1 person hand held assist(second person to advance feet during weight shifting) Transfers: Sit to/from [facility] Sit to Stand: Mod assist Stand pivot transfers: Total assist;+2 physical assistance       General transfer comment: Pt lethargic with minmal eye opening during t/f back to bed. Dependent sit to stand.    Balance Overall balance assessment: Needs assistance Sitting-balance support: Bilateral upper extremity supported;Feet supported Sitting balance-Leahy Scale: Zero     Standing balance support: Bilateral upper extremity supported;During functional activity Standing balance-Leahy Scale: Zero                             ADL either performed or assessed with clinical judgement   ADL Overall ADL's : Needs assistance/impaired Eating/Feeding: Set up;Minimal assistance;Sitting   Grooming: Wash/dry face;Set up;Minimal assistance;Sitting   Upper Body Bathing: Moderate assistance;Sitting   Lower Body Bathing: Maximal assistance;Sitting/lateral leans   Upper Body Dressing : Moderate assistance;Sitting   Lower Body Dressing: Maximal assistance;Sitting/lateral leans Lower Body Dressing  Details (indicate cue type and reason): if standing for clothing mgt over hips, requires total A Toilet Transfer: Moderate assistance;+2 for safety/equipment;BSC    Toileting- Clothing Manipulation and Hygiene: Maximal assistance;Sitting/lateral lean Toileting - Clothing Manipulation Details (indicate cue type and reason): based on clinical observation Tub/ Shower Transfer: Moderate assistance;+2 for safety/equipment Tub/Shower Transfer Details (indicate cue type and reason): based on clinical observation-arm in arm SPS Functional mobility during ADLs: (N/A at this time as pt with minimal cue following, minimal ability to advance feet/weight shift in standing.)       Vision Baseline Vision/History: Wears glasses Patient Visual Report: No change from baseline Vision Assessment?: No apparent visual deficits     Perception     Praxis      Pertinent Vitals/Pain Pain Assessment: (Pt with limited facial expression to indicate pain. Difficult to assess if pt is hurting, but she does appear to limit participation in mobility d/t pain.)     Hand Dominance     Extremity/Trunk Assessment Upper Extremity Assessment Upper Extremity Assessment: Generalized weakness;RUE deficits/detail;LUE deficits/detail RUE Deficits / Details: difficult to formally assess d/t poor cue following LUE Deficits / Details: difficult to formally assess d/t poor cue following   Lower Extremity Assessment Lower Extremity Assessment: Defer to PT evaluation;Generalized weakness;RLE deficits/detail;LLE deficits/detail LLE: Unable to fully assess due to pain   Cervical / Trunk Assessment Cervical / Trunk Assessment: Normal   Communication     Cognition Arousal/Alertness: Lethargic;Suspect due to medications Behavior During Therapy: Flat affect Overall Cognitive Status: History of cognitive impairments - at baseline(family member present states that pt is more drowsy this date.)                                     General Comments       Exercises Other Exercises Other Exercises: OT facilitates education with pt family member present in room re: role of OT,  benefits of OOB activity, general safety measures. Pt's family member verbalize understanding. Pt present throughout, but with very poor reception.   Shoulder Instructions      Home Living Family/patient expects to be discharged to:: Private residence Living Arrangements: Children Available Help at Discharge: Family Type of Home: House Home Access: Stairs to enter [employer] of Steps: 3 Entrance Stairs-Rails: None Home Layout: Two level;Able to live on main level with bedroom/bathroom         Bathroom Toilet: Standard     Home Equipment: Cane - single point          Prior Functioning/Environment Level of Independence: Needs assistance  Gait / Transfers Assistance Needed: was using SPC for fxl mobiltiy within the home at baseline. ADL's / Homemaking Assistance Needed: Pt's son's girlfriend present on eval and states that pt was mostly Independent with BADLs-supervision for bathing for safety.            OT Problem List: Decreased strength;Decreased range of motion;Decreased activity tolerance;Impaired balance (sitting and/or standing);Decreased coordination;Decreased cognition;Decreased safety awareness;Decreased knowledge of use of DME or AE;Decreased knowledge of precautions;Pain      OT Treatment/Interventions: Self-care/ADL training;Therapeutic exercise;Energy conservation;Patient/family education;Balance training;Therapeutic activities    OT Goals(Current goals can be found in the care plan section) Acute Rehab OT Goals Patient Stated Goal: to regain stamina OT Goal Formulation: With family Time For Goal Achievement: 10/03/19 Potential to Achieve Goals: Fair  OT Frequency: Min 1X/week   Barriers to D/C:  Co-evaluation PT/OT/SLP Co-Evaluation/Treatment: Yes Reason for Co-Treatment: Complexity of the patient's impairments (multi-system involvement) PT goals addressed during session: Mobility/safety with mobility OT goals addressed during  session: ADL's and self-care      AM-PAC OT "6 Clicks" Daily Activity     Outcome Measure Help from another person eating meals?: A Little Help from another person taking care of personal grooming?: A Little Help from another person toileting, which includes using toliet, bedpan, or urinal?: Total Help from another person bathing (including washing, rinsing, drying)?: Total Help from another person to put on and taking off regular upper body clothing?: A Lot Help from another person to put on and taking off regular lower body clothing?: Total 6 Click Score: 11   End of Session Equipment Utilized During Treatment: Gait belt;Other (comment)(arm in arm transfer) Nurse Communication: Mobility status(also communicated HR and BP findings.)  Activity Tolerance: Patient limited by lethargy Patient left: in bed;with call bell/phone within reach;with bed alarm set  OT Visit Diagnosis: Unsteadiness on feet (R26.81);Muscle weakness (generalized) (M62.81);History of falling (Z91.81)                Time: [id number] OT Time Calculation (min): 45 min Charges:  OT General Charges $OT Visit: 1 Visit OT Evaluation $OT Eval Moderate Complexity: 1 Mod OT Treatments $Self Care/Home Management : 8-22 mins $Therapeutic Activity: 8-22 mins  Gerrianne Scale, MS, OTR/L ascom [phone] or [phone] 09/19/19, 2:48 PM

## 2019-09-19 NOTE — Progress Notes (Signed)
MD response was to watch for a couple of hours and re-scan. If bladder scanner reveals 400 mls then do I and O cath and notify MD.

## 2019-09-19 NOTE — Progress Notes (Signed)
  Subjective: 1 Day Post-Op Procedure(s) (LRB): ARTHROPLASTY BIPOLAR HIP (HEMIARTHROPLASTY) LEFT (Left) Patient reports pain as controlled.   Patient is resting comfprtably in bed at this time. PT and care management to assist with discharge.  Negative for chest pain and shortness of breath Fever: no Gastrointestinal: Denies nausea and vomiting  Objective: Vital signs in last 24 hours: Temp:  [97.2 F (36.2 C)-98.8 F (37.1 C)] 97.7 F (36.5 C) (09/19 0810) Pulse Rate:  [57-73] 60 (09/19 0810) Resp:  [14-23] 17 (09/19 0343) BP: (82-188)/(49-92) 177/68 (09/19 0810) SpO2:  [92 %-100 %] 100 % (09/19 0810) Weight:  [59 kg] 59 kg (09/18 1056)  Intake/Output from previous day:  Intake/Output Summary (Last 24 hours) at 09/19/2019 0846 Last data filed at 09/19/2019 0616 Gross per 24 hour  Intake 2768.26 ml  Output 1500 ml  Net 1268.26 ml    Intake/Output this shift: No intake/output data recorded.  Labs: Recent Labs    09/17/19 2152 09/18/19 0444 09/19/19 0548  HGB 11.3* 11.1* 10.9*   Recent Labs    09/18/19 0444 09/19/19 0548  WBC 15.2* 18.5*  RBC 3.67* 3.57*  HCT 33.4* 32.1*  PLT 195 205   Recent Labs    09/18/19 0444 09/19/19 0548  NA 134* 134*  K 3.9 3.9  CL 100 102  CO2 25 22  BUN 26* 31*  CREATININE 0.93 1.31*  GLUCOSE 132* 139*  CALCIUM 9.1 8.6*   Recent Labs    09/17/19 2152  INR 1.1     EXAM General - Patient is Alert and Oriented Extremity - Neurovascular intact Dorsiflexion/Plantar flexion intact Compartment soft Dressing/Incision - clean, no drainage, ecchymosis present  Motor Function - intact, moving foot and toes well on exam.    Assessment/Plan: 1 Day Post-Op Procedure(s) (LRB): ARTHROPLASTY BIPOLAR HIP (HEMIARTHROPLASTY) LEFT (Left) Principal Problem:   Hip fracture (HCC) Active Problems:   HTN (hypertension)   GAD (generalized anxiety disorder)   GERD (gastroesophageal reflux disease)   CAD (coronary artery  disease)  Estimated body mass index is 20.36 kg/m as calculated from the following:   Height as of this encounter: 5\' 7"  (1.702 m).   Weight as of this encounter: 59 kg. Up with therapy  Begin working on bowel movement.  Labs reviewed, continue to monitor. Will likely need rehab following d/c from hospital.   DVT Prophylaxis - Lovenox Weight-Bearing as tolerated to left leg  Cassell Smiles, PA-C [facility] 09/19/2019, 8:46 AM

## 2019-09-19 NOTE — Progress Notes (Addendum)
Pt complaining of pain. Pain medication given with no relief. Son at bedside, questioning the oxycodone and dilaudid the patient was given. He said " the oxycodone I know is a big pill but the one my mum was given is small pill. Are you sure the dilaudid you gave to my mother is not in water form? Because it did not relief her pain at all. Son was informed that our pharmacy carry oxycodone in small sizes and also the dilaudid that his mother was given was pulled from the pyxis and its real dilaudid. Son also asking for something to make her sleep. Dr. Jannifer Franklin notified about the patients unrelieved pain and son request for sleeping aid. New orders obtained. Dr. Jannifer Franklin also asked the writer to give another 0.2mg  of dilaudid. Pt restless, pulling stuff and trying to seat up. Son requesting the writer to sedate patient. Son educated on why we do not sedate patients and the risks of sedation. Son upset, yelled at the writer," hurry and give her medication that does not relief her pain, stop typing and scanning you can do that latter."

## 2019-09-19 NOTE — Progress Notes (Signed)
Patient resting in bed, arousable and drinking Ensure.

## 2019-09-19 NOTE — Progress Notes (Signed)
Spoke with Dr. Jannifer Franklin due to low bloodpressure of 106/47 may hold blood pressure medication todnight.

## 2019-09-20 LAB — CBC
HCT: 28.1 % — ABNORMAL LOW (ref 36.0–46.0)
Hemoglobin: 9.1 g/dL — ABNORMAL LOW (ref 12.0–15.0)
MCH: 30.2 pg (ref 26.0–34.0)
MCHC: 32.4 g/dL (ref 30.0–36.0)
MCV: 93.4 fL (ref 80.0–100.0)
Platelets: 146 10*3/uL — ABNORMAL LOW (ref 150–400)
RBC: 3.01 MIL/uL — ABNORMAL LOW (ref 3.87–5.11)
RDW: 13.7 % (ref 11.5–15.5)
WBC: 11.7 10*3/uL — ABNORMAL HIGH (ref 4.0–10.5)
nRBC: 0 % (ref 0.0–0.2)

## 2019-09-20 MED ORDER — TAMSULOSIN HCL 0.4 MG PO CAPS
0.4000 mg | ORAL_CAPSULE | Freq: Every day | ORAL | Status: DC
  Administered 2019-09-20 – 2019-09-23 (×4): 0.4 mg via ORAL
  Filled 2019-09-20 (×4): qty 1

## 2019-09-20 NOTE — Progress Notes (Signed)
Physical Therapy Treatment Patient Details Name: Alexis Jackson MRN: [id number] DOB: 1927-02-19 Today's Date: 09/20/2019    History of Present Illness Alexis Jackson is an 47yoF who comes [facility] on 9/17 s/p fall at home, underwent HHA on Left femur to address femoral neck fracture. PMH: HTN, GAD, GERD, CAD, PAD, Dementia (no formal diagnosis per family), CABG, Left TKA, pulmonary fibrosis. PTA pt lived with Son and AMB with [employer]Guard and SPC short distances within the home.    PT Comments    Patient is a pleasant 85 year old female who presents with improved cognition and awareness upon PT session. Family present and able to assist with session. Patient fearful of movement of LLE however is agreeable to session. Educated on purse lipped breathing for pain reduction as well as mobility in bed prior to transfers for improved quality of movement and safety. Patient eager to get to chair for lunch and required less assistance than previous sessions with improved motivation.  Pain medicine requested at end of session and relayed to nursing.  Current POC remains appropriate at this time.     Follow Up Recommendations  SNF     Equipment Recommendations  Other (comment)    Recommendations for Other Services       Precautions / Restrictions Precautions Precautions: Posterior Hip Restrictions Weight Bearing Restrictions: Yes LLE Weight Bearing: Weight bearing as tolerated    Mobility  Bed Mobility Overal bed mobility: Needs Assistance Bed Mobility: Supine to Sit     Supine to sit: Max assist;+2 for physical assistance;HOB elevated;Mod assist     General bed mobility comments: Patient requires step by step assistance to obtain EOB position, able to assist and pull self forward, limited ability to move LLE.  Able to sit EOB intermittent Mod I  Transfers Overall transfer level: Needs assistance Equipment used: Rolling walker (2 wheeled) Transfers: Sit to/from [employer] Sit to Stand: Min assist;From elevated surface;Mod assist Stand pivot transfers: Min assist;Mod assist       General transfer comment: Patient eager to get into chair for lunch. Requires cueing for sequencing of transfers however is able to perform with use of RW and minimal assistance positioning walker.  Ambulation/Gait Ambulation/Gait assistance: Min assist;Mod assist Gait Distance (Feet): 4 Feet Assistive device: Rolling walker (2 wheeled)       General Gait Details: took four steps to get to chair, able to negotiate with use of RW and Mod A   Stairs             Wheelchair Mobility    Modified Rankin (Stroke Patients Only)       Balance Overall balance assessment: Needs assistance;History of Falls Sitting-balance support: Single extremity supported;Feet supported Sitting balance-Leahy Scale: Fair Sitting balance - Comments: able to maintain sitting balance with intermittent Mod I. Postural control: Posterior lean Standing balance support: Bilateral upper extremity supported;During functional activity Standing balance-Leahy Scale: Fair Standing balance comment: Able to stand with use of RW and assistance, no postural sway                            Cognition Arousal/Alertness: Awake/alert Behavior During Therapy: WFL for tasks assessed/performed;Flat affect Overall Cognitive Status: History of cognitive impairments - at baseline                                 General Comments: Patient is more  alert and oriented today. Family in room assisting with care.      Exercises Total Joint Exercises Ankle Circles/Pumps: AROM;Right;20 reps;AAROM;Left Other Exercises Other Exercises: Patient and family educated on safe transfers and bed mobility with patient more alert this session allowing for education to be verbalized and demonstrated for understanding. Patient fearful of movement of L hip but educated and performed mobility  within precaution allowance.    General Comments        Pertinent Vitals/Pain Pain Assessment: 0-10 Pain Score: 7  Pain Location: L hip after mobility Pain Descriptors / Indicators: Aching;Discomfort Pain Intervention(s): Limited activity within patient's tolerance;Monitored during session;Patient requesting pain meds-RN notified    Home Living                      Prior Function            PT Goals (current goals can now be found in the care plan section) Progress towards PT goals: Progressing toward goals    Frequency    BID(trial BID; QD if alertness remains limited)      PT Plan Current plan remains appropriate    Co-evaluation              AM-PAC PT "6 Clicks" Mobility   Outcome Measure  Help needed turning from your back to your side while in a flat bed without using bedrails?: Total Help needed moving from lying on your back to sitting on the side of a flat bed without using bedrails?: Total Help needed moving to and from a bed to a chair (including a wheelchair)?: A Lot Help needed standing up from a chair using your arms (e.g., wheelchair or bedside chair)?: A Lot Help needed to walk in hospital room?: Total Help needed climbing 3-5 steps with a railing? : Total 6 Click Score: 8    End of Session Equipment Utilized During Treatment: Gait belt Activity Tolerance: Other (comment);Patient limited by fatigue;Patient tolerated treatment well Patient left: with family/visitor present;with call bell/phone within reach;with SCD's reapplied;in chair;with chair alarm set(THA wedge in place) Nurse Communication: Mobility status;Patient requests pain meds PT Visit Diagnosis: Unsteadiness on feet (R26.81);Other abnormalities of gait and mobility (R26.89);Difficulty in walking, not elsewhere classified (R26.2);Muscle weakness (generalized) (M62.81);History of falling (Z91.81);Other symptoms and signs involving the nervous system (R29.898)     Time:  [id number] PT Time Calculation (min) (ACUTE ONLY): 29 min  Charges:  $Therapeutic Exercise: 8-22 mins $Therapeutic Activity: 8-22 mins                     Janna Arch, PT, DPT     Janna Arch 09/20/2019, 1:04 PM

## 2019-09-20 NOTE — Progress Notes (Signed)
250 ml of urine from in and out cath

## 2019-09-20 NOTE — Progress Notes (Signed)
Pt with 560ml on the bladder scanner and no sensation to void. Dr. Marcille Blanco to place orders.

## 2019-09-20 NOTE — Progress Notes (Signed)
[place] [facility] [place] NAME: Alexis Jackson    MR#:  [id number]  DATE OF BIRTH:  04-13-1927  SUBJECTIVE:   Patient here after a fall and noted to have a left hip fracture.  S/p Left Hip Hemiarthroplasty  POD # 2 today.   Still bradycardic but BP stable.  Also noted to have Urinary Retention and now has foley catheter.   REVIEW OF SYSTEMS:    Review of Systems  Constitutional: Negative for chills and fever.  HENT: Negative for congestion and tinnitus.   Eyes: Negative for blurred vision and double vision.  Respiratory: Negative for cough, shortness of breath and wheezing.   Cardiovascular: Negative for chest pain, orthopnea and PND.  Gastrointestinal: Negative for abdominal pain, diarrhea, nausea and vomiting.  Genitourinary: Negative for dysuria and hematuria.  Musculoskeletal: Positive for falls and joint pain (Left Hip. ).  Neurological: Negative for dizziness, sensory change and focal weakness.  All other systems reviewed and are negative.   Nutrition: Heart Healthy Tolerating Diet: Yes Tolerating PT: Await Eval.   DRUG ALLERGIES:   Allergies  Allergen Reactions  . Sulfa Antibiotics Rash    VITALS:  Blood pressure (!) 166/61, pulse (!) 56, temperature 98.2 F (36.8 C), temperature source Oral, resp. rate 16, height 5\' 7"  (1.702 m), weight 59 kg, SpO2 100 %.  PHYSICAL EXAMINATION:   Physical Exam  GENERAL:  85 y.o.-year-old patient lying in bed in no acute distress.  EYES: Pupils equal, round, reactive to light and accommodation. No scleral icterus. Extraocular muscles intact.  HEENT: Head atraumatic, normocephalic. Oropharynx and nasopharynx clear.  NECK:  Supple, no jugular venous distention. No thyroid enlargement, no tenderness.  LUNGS: Normal breath sounds bilaterally, no wheezing, rales, rhonchi. No use of accessory muscles of respiration.  CARDIOVASCULAR: S1, S2 normal. No murmurs, rubs, or gallops.  ABDOMEN:  Soft, nontender, nondistended. Bowel sounds present. No organomegaly or mass.  EXTREMITIES: No cyanosis, clubbing, Left hip dressing in place with no acute bleeding or drainage noted.     NEUROLOGIC: Cranial nerves II through XII are intact. No focal Motor or sensory deficits b/l. Globally weak with mild tremor.   PSYCHIATRIC: The patient is alert and oriented x 2.  SKIN: No obvious rash, lesion, or ulcer.    LABORATORY PANEL:   CBC Recent Labs  Lab 09/20/19 0623  WBC 11.7*  HGB 9.1*  HCT 28.1*  PLT 146*   ------------------------------------------------------------------------------------------------------------------  Chemistries  Recent Labs  Lab 09/19/19 0548  NA 134*  K 3.9  CL 102  CO2 22  GLUCOSE 139*  BUN 31*  CREATININE 1.31*  CALCIUM 8.6*   ------------------------------------------------------------------------------------------------------------------  Cardiac Enzymes No results for input(s): TROPONINI in the last 168 hours. ------------------------------------------------------------------------------------------------------------------  RADIOLOGY:  Dg Pelvis 1-2 Views  Result Date: 09/18/2019 CLINICAL DATA:  Hemiarthroplasty. EXAM: PELVIS - 1-2 VIEW COMPARISON:  CT 09/17/2019. FINDINGS: Left hip left hip hemiarthroplasty. Hardware intact. Anatomic alignment. Hemostats noted over the right pelvis. Peripheral vascular calcification noted. IMPRESSION: Left hip hemiarthroplasty with anatomic alignment. Electronically Signed   By: Marcello Moores  Register   On: 09/18/2019 13:05   Dg Pelvis Portable  Result Date: 09/18/2019 CLINICAL DATA:  The patient suffered a left hip fracture in a fall 09/17/2019. Initial encounter. EXAM: PORTABLE PELVIS 1-2 VIEWS COMPARISON:  CT and plain films left hip 09/17/2019. FINDINGS: New bipolar left hip arthroplasty is in place. The device is located. No fracture. Gas in the soft tissues and surgical staples noted.  IMPRESSION: Status post  left hip replacement.  No acute abnormality. Electronically Signed   By: Inge Rise M.D.   On: 09/18/2019 16:01     ASSESSMENT AND PLAN:   85 year old female with past medical history of dementia, pulmonary fibrosis, hypertension hyperlipidemia, GERD, status post bypass who presented to the hospital after mechanical fall noted to have a left hip fracture.  1.  Status post fall and left hip fracture- status post left hip hemiarthroplasty postop day #2 today. -Pain is well controlled.  Continue Lovenox for DVT prophylaxis, likely will need short-term rehab upon discharge.  2. Essential HTN - BP stable.  - cont. Clonidine, Losartan/HCTZ.  Hold Propranolol due to bradycardia.   3. Urinary Retention - s/p Foley cath yesterday. Will start Flomax and d/c Foley and watch for post-void residual.   4. Anxiety - cont. Klonopin/Buspar.   5. Hyperlipidemia - cont. Pravachol  6. Neuropathy - cont. Neurontin.     All the records are reviewed and case discussed with Care Management/Social Worker. Management plans discussed with the patient, family and they are in agreement.  CODE STATUS: Full code   DVT Prophylaxis: Lovenox  TOTAL TIME TAKING CARE OF THIS PATIENT: 25 minutes.   POSSIBLE D/C IN 1-2 DAYS, DEPENDING ON CLINICAL CONDITION.   Henreitta Leber M.D on 09/20/2019 at 12:17 PM  Between 7am to 6pm - Pager - [phone]  After 6pm go to [email or web address] - [employer] [facility]  Office  [phone]  CC: Primary care physician; Ezequiel Kayser, MD

## 2019-09-20 NOTE — Progress Notes (Signed)
Pt's son [name] at bedside with caregiver Cecille Rubin. This RN questioned who would be the designated visitor and visiting restrictions explained. Son [name] stated he is "a Building control surveyor on call 24 hours a day, and can only see my mother occasionally." He then explained Cecille Rubin is full time caregiver because he cannot maintain a reliable schedule, but earlier in the admission he was here and told he could come and go freely due to his job and schedule.  Son [name] was here Friday night and allowed to visit with his mother (per note from 9/18) and said no one has "hassled him before."  This RN left without further argument and alerted the charge nurse Estill Bamberg.

## 2019-09-20 NOTE — Progress Notes (Signed)
Bladder scanned pt as she had not voided in some time. Bladder scan showed 668mL. Stood pt up and got her on the bedside commode for output of ~39mLs. After patient was back in bed, RN bladder scanned her again to show residual ~331mL in the bladder. Per Dr. Stark Jock, in and out cath.

## 2019-09-20 NOTE — Progress Notes (Signed)
  Subjective: 2 Days Post-Op Procedure(s) (LRB): ARTHROPLASTY BIPOLAR HIP (HEMIARTHROPLASTY) LEFT (Left) Patient reports pain as mild while in bed but reports increased pain with PT. Patient is well, and has had no acute complaints or problems Plan is for d/c to SNF when tolerated. Negative for chest pain and shortness of breath Fever: no Gastrointestinal:Negative for nausea and vomiting  Objective: Vital signs in last 24 hours: Temp:  [97.7 F (36.5 C)-98 F (36.7 C)] 97.9 F (36.6 C) (09/19 2331) Pulse Rate:  [43-60] 50 (09/19 2331) Resp:  [17] 17 (09/19 2331) BP: (97-177)/(47-109) 134/61 (09/19 2331) SpO2:  [98 %-100 %] 98 % (09/19 2331)  Intake/Output from previous day:  Intake/Output Summary (Last 24 hours) at 09/20/2019 0800 Last data filed at 09/20/2019 0604 Gross per 24 hour  Intake 180 ml  Output 980 ml  Net -800 ml    Intake/Output this shift: No intake/output data recorded.  Labs: Recent Labs    09/17/19 2152 09/18/19 0444 09/19/19 0548 09/20/19 0623  HGB 11.3* 11.1* 10.9* 9.1*   Recent Labs    09/19/19 0548 09/20/19 0623  WBC 18.5* 11.7*  RBC 3.57* 3.01*  HCT 32.1* 28.1*  PLT 205 146*   Recent Labs    09/18/19 0444 09/19/19 0548  NA 134* 134*  K 3.9 3.9  CL 100 102  CO2 25 22  BUN 26* 31*  CREATININE 0.93 1.31*  GLUCOSE 132* 139*  CALCIUM 9.1 8.6*   Recent Labs    09/17/19 2152  INR 1.1     EXAM General - Patient is Alert and Appropriate Extremity - ABD soft Neurovascular intact Sensation intact distally Intact pulses distally Dorsiflexion/Plantar flexion intact Incision: scant drainage No cellulitis present Dressing/Incision - blood tinged drainage, no erythema to the left hip. Motor Function - intact, moving foot and toes well on exam.   Past Medical History:  Diagnosis Date  . Benign essential tremor   . CAD (coronary artery disease)   . Dementia ([place])   . GAD (generalized anxiety disorder)   . GERD  (gastroesophageal reflux disease)   . HLD (hyperlipidemia)   . Hypertension   . PAD (peripheral artery disease) ([place])   . Pulmonary fibrosis (HCC)     Assessment/Plan: 2 Days Post-Op Procedure(s) (LRB): ARTHROPLASTY BIPOLAR HIP (HEMIARTHROPLASTY) LEFT (Left) Principal Problem:   Hip fracture (HCC) Active Problems:   HTN (hypertension)   GAD (generalized anxiety disorder)   GERD (gastroesophageal reflux disease)   CAD (coronary artery disease) Anemia due to acute blood loss  Estimated body mass index is 20.36 kg/m as calculated from the following:   Height as of this encounter: 5\' 7"  (1.702 m).   Weight as of this encounter: 59 kg. Advance diet Up with therapy   Labs reviewed, Hg 9.1 this AM. WBC 11.7, down from 18.5 yesterday. Patient is passing gas, continue to work on BM. Slight increase in Creatinine, encouraged increased solid and liquid intake. Urinary retention being monitored by medicine service.  DVT Prophylaxis - Lovenox and SCDs Weight-Bearing as tolerated to left leg  J. Cameron Proud, PA-C [facility] Orthopaedic Surgery 09/20/2019, 8:00 AM

## 2019-09-20 NOTE — Progress Notes (Signed)
This nurse resumed care of patient at 1pm from [place]

## 2019-09-20 NOTE — Progress Notes (Signed)
Patient c/o 7/10 chest pain after transfer to bed from chair. See vital flow sheet at 1425. EKG ordered and completed at bedside. Upon resassessment, pt stated pain level was "0/10, no pain." Will continue to monitor.

## 2019-09-21 LAB — CBC
HCT: 26.3 % — ABNORMAL LOW (ref 36.0–46.0)
Hemoglobin: 8.7 g/dL — ABNORMAL LOW (ref 12.0–15.0)
MCH: 30.2 pg (ref 26.0–34.0)
MCHC: 33.1 g/dL (ref 30.0–36.0)
MCV: 91.3 fL (ref 80.0–100.0)
Platelets: 149 10*3/uL — ABNORMAL LOW (ref 150–400)
RBC: 2.88 MIL/uL — ABNORMAL LOW (ref 3.87–5.11)
RDW: 13.2 % (ref 11.5–15.5)
WBC: 9.7 10*3/uL (ref 4.0–10.5)
nRBC: 0 % (ref 0.0–0.2)

## 2019-09-21 LAB — SURGICAL PATHOLOGY

## 2019-09-21 MED ORDER — ENOXAPARIN SODIUM 40 MG/0.4ML ~~LOC~~ SOLN: 40.0000 mg | SUBCUTANEOUS | 0 refills | Status: DC

## 2019-09-21 MED ORDER — ENSURE ENLIVE PO LIQD
237.0000 mL | Freq: Three times a day (TID) | ORAL | Status: DC
  Administered 2019-09-22 (×3): 237 mL via ORAL

## 2019-09-21 MED ORDER — CHLORHEXIDINE GLUCONATE CLOTH 2 % EX PADS
6.0000 | MEDICATED_PAD | Freq: Every day | CUTANEOUS | Status: DC
  Administered 2019-09-21: 16:00:00 6 via TOPICAL

## 2019-09-21 MED ORDER — OXYCODONE HCL 5 MG PO TABS: 2.5000 mg | ORAL_TABLET | ORAL | 0 refills | Status: AC | PRN

## 2019-09-21 NOTE — Progress Notes (Signed)
Physical Therapy Treatment Patient Details Name: Alexis Jackson MRN: [id number] DOB: September 14, 1927 Today's Date: 09/21/2019    History of Present Illness [name] is an 35yoF who comes to [facility] on 9/17 s/p fall at home, underwent HHA on Left femur to address femoral neck fracture. PMH: HTN, GAD, GERD, CAD, PAD, Dementia (no formal diagnosis per family), CABG, Left TKA, pulmonary fibrosis. PTA pt lived with Son and AMB with minGuard and SPC short distances within the home.    PT Comments    Pt initially refuses PT due to lethargy and pain, but with gentle encouragement agreeable to supine exercises. Pt participates with assist. Pt son and pt re educated on posterior hip precautions. Positioning education as well with repositioning for comfort and adherence to posterior hip precautions. Son's questions answered to his satisfaction. Continue PT to progress strength, endurance and balance to improve all functional mobility.   Follow Up Recommendations  SNF     Equipment Recommendations       Recommendations for Other Services       Precautions / Restrictions Precautions Precautions: Posterior Hip Restrictions Weight Bearing Restrictions: Yes LLE Weight Bearing: Weight bearing as tolerated    Mobility  Bed Mobility Overal bed mobility: Needs Assistance Bed Mobility: (repositioning upward in bed)     Supine to sit: Mod assist     General bed mobility comments: Able to assist with BLE; Mod A with bed flat  Transfers Overall transfer level: Needs assistance Equipment used: Rolling walker (2 wheeled)             General transfer comment: Per chart, pt req. +2 mod assist from elevated surface and BSC this date. Req. consistent cues for adherence to posterior THP's.  Ambulation/Gait                 Stairs             Wheelchair Mobility    Modified Rankin (Stroke Patients Only)       Balance Overall balance assessment: Needs assistance;History of  Falls                                          Cognition Arousal/Alertness: Lethargic Behavior During Therapy: WFL for tasks assessed/performed;Flat affect Overall Cognitive Status: History of cognitive impairments - at baseline                                 General Comments: Pt demonstrated improved A&O this dated. Oriented to self, place, and situation. Improved awareness of limitations and hip precautions as well.      Exercises Total Joint Exercises Ankle Circles/Pumps: AROM;Both;20 reps Quad Sets: Strengthening;Both;15 reps Gluteal Sets: Strengthening;Both;15 reps Short Arc Quad: AROM;AAROM;Left;15 reps Heel Slides: AAROM;Left;15 reps Hip ABduction/ADduction: AAROM;Left;15 reps Straight Leg Raises: AAROM;Left;5 reps(3 sets) Other Exercises Other Exercises: AAROM L ER to hip neutral x 15 Other Exercises: Re educated pt/son on posterior hip precautions. Positioned with pillows and towel roll for neutral hip positioning    General Comments General comments (skin integrity, edema, etc.): Pt on 2L t/o session with SpO2 stable at 99. Pillows and towel rolls used to promote appropriate positioning and minimize internal hip rotation.      Pertinent Vitals/Pain Pain Assessment: Faces Pain Score: 5  Faces Pain Scale: Hurts whole lot Pain Location: L hip with movement  Pain Descriptors / Indicators: Aching;Grimacing;Constant Pain Intervention(s): Limited activity within patient's tolerance    Home Living                      Prior Function            PT Goals (current goals can now be found in the care plan section) Acute Rehab PT Goals Patient Stated Goal: to regain stamina Progress towards PT goals: Progressing toward goals    Frequency    BID      PT Plan Current plan remains appropriate    Co-evaluation              AM-PAC PT "6 Clicks" Mobility   Outcome Measure  Help needed turning from your back to your  side while in a flat bed without using bedrails?: A Little Help needed moving from lying on your back to sitting on the side of a flat bed without using bedrails?: A Lot Help needed moving to and from a bed to a chair (including a wheelchair)?: A Lot Help needed standing up from a chair using your arms (e.g., wheelchair or bedside chair)?: A Lot Help needed to walk in hospital room?: Total Help needed climbing 3-5 steps with a railing? : Total 6 Click Score: 11    End of Session Equipment Utilized During Treatment: Gait belt;Oxygen Activity Tolerance: Patient tolerated treatment well Patient left: with call bell/phone within reach;with family/visitor present;with SCD's reapplied;in bed;with bed alarm set   PT Visit Diagnosis: Unsteadiness on feet (R26.81);Other abnormalities of gait and mobility (R26.89);Difficulty in walking, not elsewhere classified (R26.2);Muscle weakness (generalized) (M62.81);History of falling (Z91.81);Other symptoms and signs involving the nervous system (R29.898)     Time: [id number] PT Time Calculation (min) (ACUTE ONLY): 31 min  Charges:  $Therapeutic Exercise: 8-22 mins $Therapeutic Activity: 8-22 mins                      [name], PTA 09/21/2019, 4:33 PM

## 2019-09-21 NOTE — Progress Notes (Signed)
Pt was able to have a large void on her own.

## 2019-09-21 NOTE — Progress Notes (Signed)
Pt noted to have a copious amount of blood under tegaderm as this writer was giving PRN cepacol for c/o sore throat. Removed IV and tegaderm ,,, then more blood noted. Noted to have Skin tear to LFA. Applied Xerform and wrapped with Kerlix dressing. IV team consult ordered , No c/o pain or discomfort. Will continue to monitor

## 2019-09-21 NOTE — TOC Progression Note (Signed)
Transition of Care [facility]) - Progression Note    Patient Details  Name: Alexis Jackson MRN: [id number] Date of Birth: 1927/06/22  Transition [facility] Care [facility]) CM/SW Contact  Su Hilt, RN Phone Number: 09/21/2019, 12:08 PM  Clinical Narrative:    Marden Noble with the [facility] has stated that the patient was offered a bed at the [facility].  I notified [name], the patients son and he stated that they would like to accept the bed offer and start the insurance auth         Expected Discharge Plan and Services                                                 Social Determinants of Health (SDOH) Interventions    Readmission Risk Interventions No flowsheet data found.

## 2019-09-21 NOTE — Progress Notes (Signed)
Physical Therapy Treatment Patient Details Name: Alexis Jackson MRN: [id number] DOB: 10-Mar-1927 Today's Date: 09/21/2019    History of Present Illness Alexis Jackson is an 18yoF who comes to [facility] on 9/17 s/p fall at home, underwent HHA on Left femur to address femoral neck fracture. PMH: HTN, GAD, GERD, CAD, PAD, Dementia (no formal diagnosis per family), CABG, Left TKA, pulmonary fibrosis. PTA pt lived with Son and AMB with minGuard and SPC short distances within the home.    PT Comments    Pt agreeable to PT; reports 6/10 pain in L hip via faces scale. Upon arrival, pt found with hip abductor wedge placed; however, pt noted to bed in extreme L hip internal rotation and partial sidelying with L hip adducted out of wedge. Wedge removed and pt educated on posterior hip precautions and positioning. Pt requires Mod A of 1 for supine to sit to edge of bed initially with poor sitting posture (forward slump). Continued education on post'r hip precautions as it relates to function. Improved sitting with BUE support to supervision with time. Transfers bed to [facility] and back then bed to recliner with +2 and rolling walker. Pt demonstrates ability to take small steps with cues for short ambulation and Min A of 2. Mod A of 2 to descend to sit. Pt was incontinent of bladder and loose bowel initially to commode, but non afterward. Pt receives up in chair and educated on basic ankle pumps, QS and GS to practice throughout the day for strengthening and educated on its carryover to function. Continue PT this afternoon to progress strength, endurance and balance to improve all functional mobility and increase awareness of posterior hip precautions.   Follow Up Recommendations  SNF     Equipment Recommendations       Recommendations for Other Services       Precautions / Restrictions Precautions Precautions: Posterior Hip Restrictions Weight Bearing Restrictions: Yes LLE Weight Bearing: Weight bearing as  tolerated    Mobility  Bed Mobility Overal bed mobility: Needs Assistance Bed Mobility: Supine to Sit     Supine to sit: Mod assist     General bed mobility comments: Gives good effort; able to mobilize BLE to edge of bed with Min A to move LLE over edge of bed. Initiates trunk; requires Mod A to perform  Transfers Overall transfer level: Needs assistance Equipment used: Rolling walker (2 wheeled) Transfers: Sit to/from Stand Sit to Stand: Mod assist;+2 physical assistance;From elevated surface(also from [facility])         General transfer comment: Consistent cues to adhere to posterior precautions  Ambulation/Gait Ambulation/Gait assistance: Min assist;+2 physical assistance Gait Distance (Feet): 3 Feet(3 times bed to/from [facility]; bed to chair) Assistive device: Rolling walker (2 wheeled) Gait Pattern/deviations: Step-to pattern;Trunk flexed     General Gait Details: Cues/encouragment for sequence/stepping   Stairs             Wheelchair Mobility    Modified Rankin (Stroke Patients Only)       Balance Overall balance assessment: Needs assistance;History of Falls Sitting-balance support: Feet supported;Bilateral upper extremity supported Sitting balance-Leahy Scale: Fair Sitting balance - Comments: Initially heavy forward lean requiring assist to maintain upright balance. Improves with work   Standing balance support: Bilateral upper extremity supported;During functional activity Standing balance-Leahy Scale: Fair                              Cognition Arousal/Alertness: Awake/alert Behavior  During Therapy: WFL for tasks assessed/performed;Flat affect Overall Cognitive Status: History of cognitive impairments - at baseline                                 General Comments: Follows instructions well; mildly impulsive initially needing increased consistent cues to wait for therapist. Improves throughout session with pt then asking to get  up before attempting on own      Exercises Total Joint Exercises Ankle Circles/Pumps: AROM;Both;20 reps Quad Sets: Strengthening;Both;10 reps Gluteal Sets: Strengthening;Both;10 reps Other Exercises Other Exercises: AAROM L ER to hip neutral    General Comments        Pertinent Vitals/Pain Pain Assessment: Faces Faces Pain Scale: Hurts even more Pain Location: L hip with movement Pain Descriptors / Indicators: Aching;Grimacing    Home Living                      Prior Function            PT Goals (current goals can now be found in the care plan section) Progress towards PT goals: Progressing toward goals    Frequency    BID      PT Plan Current plan remains appropriate    Co-evaluation              AM-PAC PT "6 Clicks" Mobility   Outcome Measure  Help needed turning from your back to your side while in a flat bed without using bedrails?: A Little Help needed moving from lying on your back to sitting on the side of a flat bed without using bedrails?: A Lot Help needed moving to and from a bed to a chair (including a wheelchair)?: A Lot Help needed standing up from a chair using your arms (e.g., wheelchair or bedside chair)?: A Lot Help needed to walk in hospital room?: Total Help needed climbing 3-5 steps with a railing? : Total 6 Click Score: 11    End of Session Equipment Utilized During Treatment: Gait belt;Oxygen Activity Tolerance: Patient tolerated treatment well Patient left: with call bell/phone within reach;in chair;with chair alarm set;with family/visitor present;with SCD's reapplied   PT Visit Diagnosis: Unsteadiness on feet (R26.81);Other abnormalities of gait and mobility (R26.89);Difficulty in walking, not elsewhere classified (R26.2);Muscle weakness (generalized) (M62.81);History of falling (Z91.81);Other symptoms and signs involving the nervous system (R29.898)     Time: [id number] PT Time Calculation (min) (ACUTE ONLY): 30  min  Charges:  $Gait Training: 8-22 mins $Therapeutic Activity: 8-22 mins                      [name], PTA 09/21/2019, 10:49 AM

## 2019-09-21 NOTE — Progress Notes (Signed)
  Subjective: 3 Days Post-Op Procedure(s) (LRB): ARTHROPLASTY BIPOLAR HIP (HEMIARTHROPLASTY) LEFT (Left) Patient reports pain as mild while in bed but reports increased pain with PT. she ambulated 4 feet yesterday. Patient is well, and has had no acute complaints or problems Plan is for d/c to SNF when tolerated.  Possible today. Negative for chest pain and shortness of breath Fever: no Gastrointestinal:Negative for nausea and vomiting  Objective: Vital signs in last 24 hours: Temp:  [98 F (36.7 C)-100 F (37.8 C)] 100 F (37.8 C) (09/20 2311) Pulse Rate:  [56-68] 68 (09/20 2311) Resp:  [16-20] 17 (09/20 2311) BP: (131-166)/(46-61) 131/46 (09/20 2311) SpO2:  [89 %-100 %] 95 % (09/20 2315)  Intake/Output from previous day:  Intake/Output Summary (Last 24 hours) at 09/21/2019 0726 Last data filed at 09/21/2019 0539 Gross per 24 hour  Intake -  Output 900 ml  Net -900 ml    Intake/Output this shift: No intake/output data recorded.  Labs: Recent Labs    09/19/19 0548 09/20/19 0623 09/21/19 0514  HGB 10.9* 9.1* 8.7*   Recent Labs    09/20/19 0623 09/21/19 0514  WBC 11.7* 9.7  RBC 3.01* 2.88*  HCT 28.1* 26.3*  PLT 146* 149*   Recent Labs    09/19/19 0548  NA 134*  K 3.9  CL 102  CO2 22  BUN 31*  CREATININE 1.31*  GLUCOSE 139*  CALCIUM 8.6*   No results for input(s): LABPT, INR in the last 72 hours.   EXAM General - Patient is Alert and Appropriate Extremity - ABD soft Neurovascular intact Sensation intact distally Intact pulses distally Dorsiflexion/Plantar flexion intact Incision: scant drainage No cellulitis present Dressing/Incision - blood tinged drainage, no erythema to the left hip. Motor Function - intact, moving foot and toes well on exam.   Past Medical History:  Diagnosis Date  . Benign essential tremor   . CAD (coronary artery disease)   . Dementia ([place])   . GAD (generalized anxiety disorder)   . GERD (gastroesophageal reflux  disease)   . HLD (hyperlipidemia)   . Hypertension   . PAD (peripheral artery disease) ([place])   . Pulmonary fibrosis (H[facility])     Assessment/Plan: 3 Days Post-Op Procedure(s) (LRB): ARTHROPLASTY BIPOLAR HIP (HEMIARTHROPLASTY) LEFT (Left) Principal Problem:   Hip fracture (H[facility]) Active Problems:   HTN (hypertension)   GAD (generalized anxiety disorder)   GERD (gastroesophageal reflux disease)   CAD (coronary artery disease) Anemia due to acute blood loss  Estimated body mass index is 20.36 kg/m as calculated from the following:   Height as of this encounter: 5\' 7"  (1.702 m).   Weight as of this encounter: 59 kg. Advance diet Up with therapy   Labs reviewed, Hg 8.7 this AM. WBC 9.7, down from 11.7 yesterday. Patient is passing gas, continue to work on BM. Slight increase in Creatinine, encouraged increased solid and liquid intake. Urinary retention being monitored by medicine service. Follow-up at [facility] clinic in 2 weeks for staple removal.  DVT Prophylaxis - Lovenox and SCDs Weight-Bearing as tolerated to left leg  Reche [place], PA-C [facility] 09/21/2019, 7:26 AM

## 2019-09-21 NOTE — TOC Initial Note (Signed)
Transition of Care [facility]) - Initial/Assessment Note    Patient Details  Name: Annecy Steff MRN: [id number] Date of Birth: [birth year]  Transition of Care [facility]) CM/SW Contact:    Su Hilt, RN Phone Number: 09/21/2019, 8:50 AM  Clinical Narrative:                  Patient lives with her son and his girlfriend, She plans to go to rehab at [facility] Bed search sent, FL2 and PASSR completed, will review bed offers once obtained       Patient Goals and CMS Choice        Expected Discharge Plan and Services                                                Prior Living Arrangements/Services                       Activities of Daily Living Home Assistive Devices/Equipment: Shower chair with back, Environmental consultant (specify type) ADL Screening (condition at time of admission) Patient's cognitive ability adequate to safely complete daily activities?: Yes Is the patient deaf or have difficulty hearing?: No Does the patient have difficulty seeing, even when wearing glasses/contacts?: No Does the patient have difficulty concentrating, remembering, or making decisions?: No Patient able to express need for assistance with ADLs?: Yes Does the patient have difficulty dressing or bathing?: Yes Independently performs ADLs?: Yes (appropriate for developmental age) Does the patient have difficulty walking or climbing stairs?: Yes Weakness of Legs: Left Weakness of Arms/Hands: None  Permission Sought/Granted                  Emotional Assessment              Admission diagnosis:  Fall [W19.XXXA] Pre-op chest exam [id number] Fall, initial encounter [W19.XXXA] Closed fracture of neck of left femur, initial encounter ([place]) [S72.002A] Patient Active Problem List   Diagnosis Date Noted  . Hip fracture ([place]) 09/17/2019  . HTN (hypertension) 09/17/2019  . GAD (generalized anxiety disorder) 09/17/2019  . GERD (gastroesophageal reflux disease) 09/17/2019  . CAD  (coronary artery disease) 09/17/2019   PCP:  Ezequiel Kayser, MD Pharmacy:   [id number] - [place], [place] [address] Phone: [phone] Fax: [phone]     Social Determinants of Health (SDOH) Interventions    Readmission Risk Interventions No flowsheet data found.

## 2019-09-21 NOTE — TOC Progression Note (Signed)
Transition of Care [facility]) - Progression Note    Patient Details  Name: Alexis Jackson MRN: [id number] Date of Birth: 28-Dec-1927  Transition of Care [facility]) CM/SW Contact  Su Hilt, RN Phone Number: 09/21/2019, 9:47 AM  Clinical Narrative:     Spoke with the patient in the room and her son [name] on the phone.  I reviewed each bed offer in detail, [name] asked that I see if the [facility] in [place] has a bed.  He stated that would be between him and his son.  I explained that no facility has visitation at this time and also explained that there is a 14 day quarantine  He stated understanding.   I called doug at the [facility] and requested to have him check to see if they have a bed they can offer.  He will check and get back with me      Expected Discharge Plan and Services                                                 Social Determinants of Health (SDOH) Interventions    Readmission Risk Interventions No flowsheet data found.

## 2019-09-21 NOTE — Progress Notes (Signed)
Occupational Therapy Treatment Patient Details Name: Alexis Jackson MRN: [id number] DOB: [birth year] Today's Date: 09/21/2019    History of present illness Alexis Jackson is an 17yoF who comes to [facility] on 9/17 s/p fall at home, underwent HHA on Left femur to address femoral neck fracture. PMH: HTN, GAD, GERD, CAD, PAD, Dementia (no formal diagnosis per family), CABG, Left TKA, pulmonary fibrosis. PTA pt lived with Son and AMB with minGuard and SPC short distances within the home.   OT comments  Ms. Waddington was seen for OT tx session this date. Pt received supine in bed with caregiver (son) present at bedside. Pt endorsing 5/10 pain and significant fatigue this date. Declines OOB/mobility attempts. Agreeable to discussing posterior THP's and considerations for ADL mgt. Pt and caregiver education on posterior hip precautions. Handout provided. Pt able to return verbalize 1/3 hip precautions at end of session and 3/3 with moderate cueing and use of handout/visual demonstration. Pt assisted with bedding check/re-adjustment with CNA providing +2 assist this date. Pt tolerated rolling in bed and was able to reach across to bed railing to participate during this activity. Pillows adjusted under legs to support safe positioning and minimize internal rotation of the L hip. MD in room to assess pt at end of session. Pt making progress toward goals and continues to benefit from skilled OT services. Will continue to follow POC as written. DC recommendation remains appropriate.    Follow Up Recommendations  SNF    Equipment Recommendations  Other (comment)(TBD)    Recommendations for Other Services      Precautions / Restrictions Precautions Precautions: Posterior Hip Restrictions Weight Bearing Restrictions: Yes LLE Weight Bearing: Weight bearing as tolerated       Mobility Bed Mobility Overal bed mobility: Needs Assistance Bed Mobility: Supine to Sit     Supine to sit: Mod assist     General bed  mobility comments: Pt declines OOB/mobility attempts with OT this date 2/2 increased fatigue and pain. Per chart, pt req. mod assist earlier this date for sup<>sit.  Transfers Overall transfer level: Needs assistance Equipment used: Rolling walker (2 wheeled)             General transfer comment: Per chart, pt req. +2 mod assist from elevated surface and BSC this date. Req. consistent cues for adherence to posterior THP's.    Balance Overall balance assessment: Needs assistance;History of Falls                                         ADL either performed or assessed with clinical judgement   ADL                                         General ADL Comments: Pt continues to be limited by pain and posterior THPs. Pt is +2 min-moderate assist for funtional mobility this date. Has t/f'd to [facility] with +2 assist. Mod-max assist for LB ADL mgt in seated position due to increased pain and decreased ROM of the R hip.     Vision Baseline Vision/History: Wears glasses Patient Visual Report: No change from baseline     Perception     Praxis      Cognition Arousal/Alertness: Awake/alert Behavior During Therapy: WFL for tasks assessed/performed;Flat affect Overall Cognitive Status: History of cognitive impairments -  at baseline                                 General Comments: Pt demonstrated improved A&O this dated. Oriented to self, place, and situation. Improved awareness of limitations and hip precautions as well.        Exercises Other Exercises Other Exercises: Pt assisted with bed mobility during bedding check. CNA and OT provided mod-max +2 assist for pt to roll in bed. Other Exercises: Pt and caregiver (son) educated in posterior hip precautions and implications for ADL mgt on this date. Pt able to return verbalize 1/3 THPs at end of session. Will continue to educate and instruct. Handout provided; left with caregiver.    Shoulder Instructions       General Comments Pt on 2L t/o session with SpO2 stable at 99. Pillows and towel rolls used to promote appropriate positioning and minimize internal hip rotation.    Pertinent Vitals/ Pain       Pain Score: 5  Pain Location: L hip with movement Pain Descriptors / Indicators: Aching;Grimacing Pain Intervention(s): Limited activity within patient's tolerance;Monitored during session;Repositioned;Premedicated before session  Home Living                                          Prior Functioning/Environment              Frequency  Min 1X/week        Progress Toward Goals  OT Goals(current goals can now be found in the care plan section)  Progress towards OT goals: Progressing toward goals  Acute Rehab OT Goals Patient Stated Goal: to regain stamina OT Goal Formulation: With family Time For Goal Achievement: 10/03/19 Potential to Achieve Goals: [place] Discharge plan remains appropriate;Frequency remains appropriate    Co-evaluation                 AM-PAC OT "6 Clicks" Daily Activity     Outcome Measure   Help from another person eating meals?: A Little Help from another person taking care of personal grooming?: A Little Help from another person toileting, which includes using toliet, bedpan, or urinal?: Total Help from another person bathing (including washing, rinsing, drying)?: Total Help from another person to put on and taking off regular upper body clothing?: A Lot Help from another person to put on and taking off regular lower body clothing?: Total 6 Click Score: 11    End of Session    OT Visit Diagnosis: Unsteadiness on feet (R26.81);Muscle weakness (generalized) (M62.81);History of falling (Z91.81)   Activity Tolerance Patient limited by fatigue;Patient limited by pain   Patient Left in bed;with call bell/phone within reach;with bed alarm set   Nurse Communication          Time:  [id number] OT Time Calculation (min): 23 min  Charges: OT General Charges $OT Visit: 1 Visit OT Treatments $Self Care/Home Management : 8-22 mins  Shara Blazing, M.S., OTR/L Ascom: [phone] 09/21/19, 4:03 PM

## 2019-09-21 NOTE — Care Management Important Message (Signed)
Important Message  Patient Details  Name: Alexis Jackson MRN: [id number] Date of Birth: 23-Dec-1927   Medicare Important Message Given:  Yes     [name] A Andee Chivers 09/21/2019, 11:52 AM

## 2019-09-21 NOTE — Progress Notes (Signed)
Pt up to [facility] x 2 assist , small BM noted. No c/o pain or discomfort in left hip . Honeycomb dressing placed per order. Tolerates meds well with applesauce. Voiding without difficulty . Up in recliner chair for some time then requested to go back to bed. Assist x 2 to bed and used walker for xfer. Lying in bed at this time. Son at bedside , call bell and hydration within reach

## 2019-09-21 NOTE — NC FL2 (Signed)
[place] LEVEL OF CARE SCREENING TOOL     IDENTIFICATION  Patient Name: Alexis Jackson Birthdate: 1927-02-14 Sex: female Admission Date (Current Location): 09/17/2019  [place] and [place] Number:  Engineering geologist and Address:  [facility], [address] [place], [place] [address]      Provider Number: [id number]  Attending Physician Name and Address:  Henreitta Leber, MD  Relative Name and Phone Number:  Tilman [phone]    Current Level of Care: Hospital Recommended Level of Care: [place] Prior Approval Number:    Date Approved/Denied:   PASRR Number: [id number]  Discharge Plan: SNF    Current Diagnoses: Patient Active Problem List   Diagnosis Date Noted  . Hip fracture ([place]) 09/17/2019  . HTN (hypertension) 09/17/2019  . GAD (generalized anxiety disorder) 09/17/2019  . GERD (gastroesophageal reflux disease) 09/17/2019  . CAD (coronary artery disease) 09/17/2019    Orientation RESPIRATION BLADDER Height & Weight     Self, Time, Situation, Place  Normal Continent Weight: 59 kg Height:  5\' 7"  (170.2 cm)  BEHAVIORAL SYMPTOMS/MOOD NEUROLOGICAL BOWEL NUTRITION STATUS      Continent    AMBULATORY STATUS COMMUNICATION OF NEEDS Skin   Extensive Assist Verbally Normal, Surgical wounds                       Personal Care Assistance Level of Assistance  Dressing     Dressing Assistance: Limited assistance     Functional Limitations Info  Sight, Hearing, Speech Sight Info: Adequate Hearing Info: Adequate Speech Info: Adequate    SPECIAL CARE FACTORS FREQUENCY                       Contractures Contractures Info: Not present    Additional Factors Info  Allergies   Allergies Info: Sulfa Antibiotics           Current Medications (09/21/2019):  This is the current hospital active medication list Current Facility-Administered Medications  Medication Dose Route Frequency  Provider Last Rate Last Dose  . amLODipine (NORVASC) tablet 2.5 mg  2.5 mg Oral Daily Leim Fabry, MD   2.5 mg at 09/20/19 0951  . bisacodyl (DULCOLAX) suppository 10 mg  10 mg Rectal Daily PRN Leim Fabry, MD   10 mg at 09/21/19 0254  . busPIRone (BUSPAR) tablet 5 mg  5 mg Oral BID Henreitta Leber, MD   5 mg at 09/20/19 2100  . clonazePAM (KLONOPIN) tablet 0.5 mg  0.5 mg Oral BID PRN Leim Fabry, MD   0.5 mg at 09/19/19 0836  . cloNIDine (CATAPRES) tablet 0.1 mg  0.1 mg Oral PRN Leim Fabry, MD      . docusate sodium (COLACE) capsule 100 mg  100 mg Oral BID Leim Fabry, MD   100 mg at 09/20/19 2100  . enoxaparin (LOVENOX) injection 40 mg  40 mg Subcutaneous Q24H Leim Fabry, MD   40 mg at 09/20/19 0953  . famotidine (PEPCID) tablet 40 mg  40 mg Oral Daily Leim Fabry, MD   40 mg at 09/20/19 0951  . gabapentin (NEURONTIN) capsule 200 mg  200 mg Oral TID Henreitta Leber, MD   200 mg at 09/20/19 2100  . losartan (COZAAR) tablet 100 mg  100 mg Oral Daily Henreitta Leber, MD   100 mg at 09/20/19 [id number]   And  . hydrochlorothiazide (MICROZIDE) capsule 12.5 mg  12.5 mg Oral Daily [place], Harriette Ohara  J, MD   12.5 mg at 09/20/19 0951  . HYDROmorphone (DILAUDID) injection 0.2-0.4 mg  0.2-0.4 mg Intravenous Q4H PRN Henreitta Leber, MD   0.2 mg at 09/18/19 2145  . menthol-cetylpyridinium (CEPACOL) lozenge 3 mg  1 lozenge Oral PRN Leim Fabry, MD       Or  . phenol (CHLORASEPTIC) mouth spray 1 spray  1 spray Mouth/Throat PRN Leim Fabry, MD      . metoCLOPramide (REGLAN) tablet 5-10 mg  5-10 mg Oral Q8H PRN Leim Fabry, MD       Or  . metoCLOPramide (REGLAN) injection 5-10 mg  5-10 mg Intravenous Q8H PRN Leim Fabry, MD      . ondansetron [facility]) tablet 4 mg  4 mg Oral Q6H PRN Leim Fabry, MD       Or  . ondansetron [facility]) injection 4 mg  4 mg Intravenous Q6H PRN Leim Fabry, MD      . oxyCODONE (Oxy IR/ROXICODONE) immediate release tablet 2.5-5 mg  2.5-5 mg Oral Q4H PRN Leim Fabry, MD   5  mg at 09/18/19 1926  . oxyCODONE (Oxy IR/ROXICODONE) immediate release tablet 5-10 mg  5-10 mg Oral Q4H PRN Leim Fabry, MD   10 mg at 09/20/19 1426  . pravastatin (PRAVACHOL) tablet 20 mg  20 mg Oral QPM Leim Fabry, MD   20 mg at 09/20/19 1706  . senna-docusate (Senokot-S) tablet 1 tablet  1 tablet Oral QHS PRN Leim Fabry, MD      . tamsulosin [facility]) capsule 0.4 mg  0.4 mg Oral Daily Henreitta Leber, MD   0.4 mg at 09/20/19 0952  . traZODone (DESYREL) tablet 50 mg  50 mg Oral QHS PRN Lance Coon, MD   50 mg at 09/18/19 2145     Discharge Medications: Please see discharge summary for a list of discharge medications.  Relevant Imaging Results:  Relevant Lab Results:   Additional Information [id number]  Su Hilt, RN

## 2019-09-21 NOTE — Progress Notes (Signed)
[place] [facility] [place] NAME: Alexis Jackson    MR#:  [id number]  DATE OF BIRTH:  04/30/1927  SUBJECTIVE:   Patient here after a fall and noted to have a left hip fracture.  S/p Left Hip Hemiarthroplasty  POD # 3 today.   Bradycardia has improved.  Urinary Retention improved and foley has been removed.   REVIEW OF SYSTEMS:    Review of Systems  Constitutional: Negative for chills and fever.  HENT: Negative for congestion and tinnitus.   Eyes: Negative for blurred vision and double vision.  Respiratory: Negative for cough, shortness of breath and wheezing.   Cardiovascular: Negative for chest pain, orthopnea and PND.  Gastrointestinal: Negative for abdominal pain, diarrhea, nausea and vomiting.  Genitourinary: Negative for dysuria and hematuria.  Musculoskeletal: Positive for falls and joint pain (Left Hip. ).  Neurological: Negative for dizziness, sensory change and focal weakness.  All other systems reviewed and are negative.   Nutrition: Heart Healthy Tolerating Diet: Yes Tolerating PT: Await Eval.   DRUG ALLERGIES:   Allergies  Allergen Reactions  . Sulfa Antibiotics Rash    VITALS:  Blood pressure (!) 136/49, pulse 67, temperature 99.5 F (37.5 C), temperature source Oral, resp. rate 16, height 5\' 7"  (1.702 m), weight 59 kg, SpO2 96 %.  PHYSICAL EXAMINATION:   Physical Exam  GENERAL:  85 y.o.-year-old patient lying in bed in no acute distress.  EYES: Pupils equal, round, reactive to light and accommodation. No scleral icterus. Extraocular muscles intact.  HEENT: Head atraumatic, normocephalic. Oropharynx and nasopharynx clear.  NECK:  Supple, no jugular venous distention. No thyroid enlargement, no tenderness.  LUNGS: Normal breath sounds bilaterally, no wheezing, rales, rhonchi. No use of accessory muscles of respiration.  CARDIOVASCULAR: S1, S2 normal. No murmurs, rubs, or gallops.  ABDOMEN: Soft, nontender,  nondistended. Bowel sounds present. No organomegaly or mass.  EXTREMITIES: No cyanosis, clubbing, Left hip dressing in place with no acute bleeding or drainage noted.     NEUROLOGIC: Cranial nerves II through XII are intact. No focal Motor or sensory deficits b/l. Globally weak with mild tremor.   PSYCHIATRIC: The patient is alert and oriented x 2.  SKIN: No obvious rash, lesion, or ulcer.    LABORATORY PANEL:   CBC Recent Labs  Lab 09/21/19 0514  WBC 9.7  HGB 8.7*  HCT 26.3*  PLT 149*   ------------------------------------------------------------------------------------------------------------------  Chemistries  Recent Labs  Lab 09/19/19 0548  NA 134*  K 3.9  CL 102  CO2 22  GLUCOSE 139*  BUN 31*  CREATININE 1.31*  CALCIUM 8.6*   ------------------------------------------------------------------------------------------------------------------  Cardiac Enzymes No results for input(s): TROPONINI in the last 168 hours. ------------------------------------------------------------------------------------------------------------------  RADIOLOGY:  No results found.   ASSESSMENT AND PLAN:   85 year old female with past medical history of dementia, pulmonary fibrosis, hypertension hyperlipidemia, GERD, status post bypass who presented to the hospital after mechanical fall noted to have a left hip fracture.  1.  Status post fall and left hip fracture- status post left hip hemiarthroplasty postop day #3 today. -Pain is well controlled.  Cont. PT as tolerated.  - Continue Lovenox for DVT prophylaxis, likely will need short-term rehab upon discharge.  2. Essential HTN - BP stable.  - cont. Clonidine, Losartan/HCTZ.  Hold Propranolol due to bradycardia.   3. Urinary Retention - improved.  Foley catheter was removed yesterday.  - cont. Flomax.   4. Anxiety - cont. Klonopin/Buspar.   5. Hyperlipidemia -  cont. Pravachol  6. Neuropathy - cont. Neurontin.   Likely  discharge to short-term rehab next 24 to 48 hours.  All the records are reviewed and case discussed with Care Management/Social Worker. Management plans discussed with the patient, family and they are in agreement.  CODE STATUS: Full code   DVT Prophylaxis: Lovenox  TOTAL TIME TAKING CARE OF THIS PATIENT: 25 minutes.   POSSIBLE D/C IN 1-2 DAYS, DEPENDING ON CLINICAL CONDITION.   Henreitta Leber M.D on 09/21/2019 at 1:13 PM  Between 7am to 6pm - Pager - [phone]  After 6pm go to [email or web address] - [employer] [place] [facility]  Office  [phone]  CC: Primary care physician; Ezequiel Kayser, MD

## 2019-09-21 NOTE — Discharge Instructions (Signed)

## 2019-09-22 MED ORDER — PROPRANOLOL HCL 20 MG PO TABS
10.0000 mg | ORAL_TABLET | Freq: Two times a day (BID) | ORAL | Status: DC
  Administered 2019-09-22 – 2019-09-23 (×3): 10 mg via ORAL
  Filled 2019-09-22 (×3): qty 1

## 2019-09-22 NOTE — Progress Notes (Signed)
Physical Therapy Treatment Patient Details Name: Alexis Jackson MRN: [id number] DOB: 06-02-1927 Today's Date: 09/22/2019    History of Present Illness Alexis Jackson is an 56yoF who comes to [facility] on 9/17 s/p fall at home, underwent HHA on Left femur to address femoral neck fracture. PMH: HTN, GAD, GERD, CAD, PAD, Dementia (no formal diagnosis per family), CABG, Left TKA, pulmonary fibrosis. PTA pt lived with Son and AMB with minGuard and SPC short distances within the home.    PT Comments    Participated in exercises as described below.  To edge of bed with good effort with only assist to raise trunk up off bed.  Once sitting, generally steady.  Stood with poor hand placements despite education - pulled up on walker.  Min a x 1 to stand and cues to stand fully upright.  She is able to transfer to recliner with min/mod a x 1 with overall improvement over yesterday.  She is unable to progress gait however.  Generally fatigued but remained in recliner.   Follow Up Recommendations  SNF     Equipment Recommendations  Rolling walker with 5" wheels;3in1 (PT)    Recommendations for Other Services       Precautions / Restrictions Precautions Precautions: Posterior Hip    Mobility  Bed Mobility Overal bed mobility: Needs Assistance Bed Mobility: Supine to Sit     Supine to sit: Min assist        Transfers Overall transfer level: Needs assistance Equipment used: Rolling walker (2 wheeled) Transfers: Sit to/from Stand Sit to Stand: Min assist         General transfer comment: significant improvement today with +1 assist.  Ambulation/Gait Ambulation/Gait assistance: Min assist;Mod assist Gait Distance (Feet): 3 Feet Assistive device: Rolling walker (2 wheeled) Gait Pattern/deviations: Step-to pattern;Trunk flexed;Narrow base of support;Decreased step length - right;Decreased step length - left;Decreased stance time - left Gait velocity: decreased       Stairs              Wheelchair Mobility    Modified Rankin (Stroke Patients Only)       Balance Overall balance assessment: Needs assistance;History of Falls Sitting-balance support: Feet supported Sitting balance-Leahy Scale: Fair     Standing balance support: Bilateral upper extremity supported Standing balance-Leahy Scale: Poor Standing balance comment: dynamic - Hands on +1 at all times                            Cognition Arousal/Alertness: Awake/alert Behavior During Therapy: WFL for tasks assessed/performed Overall Cognitive Status: History of cognitive impairments - at baseline                                 General Comments: more engaed today      Exercises Other Exercises Other Exercises: LLE AAROM - ankle pumps, heel slides, ab/add and SLR x 10    General Comments        Pertinent Vitals/Pain      Home Living                      Prior Function            PT Goals (current goals can now be found in the care plan section) Progress towards PT goals: Progressing toward goals    Frequency    BID      PT  Plan Current plan remains appropriate    Co-evaluation              AM-PAC PT "6 Clicks" Mobility   Outcome Measure  Help needed turning from your back to your side while in a flat bed without using bedrails?: A Little Help needed moving from lying on your back to sitting on the side of a flat bed without using bedrails?: A Lot Help needed moving to and from a bed to a chair (including a wheelchair)?: A Lot Help needed standing up from a chair using your arms (e.g., wheelchair or bedside chair)?: A Lot Help needed to walk in hospital room?: Total Help needed climbing 3-5 steps with a railing? : Total 6 Click Score: 11    End of Session Equipment Utilized During Treatment: Gait belt Activity Tolerance: Patient tolerated treatment well Patient left: in chair;with call bell/phone within reach;with chair alarm  set Nurse Communication: Mobility status       Time: [id number] PT Time Calculation (min) (ACUTE ONLY): 13 min  Charges:  $Therapeutic Exercise: 8-22 mins                    Chesley Noon, PTA 09/22/19, 10:09 AM

## 2019-09-22 NOTE — TOC Progression Note (Signed)
Transition of Care [facility]) - Progression Note    Patient Details  Name: Alexis Jackson MRN: [id number] Date of Birth: 03-Apr-1927  Transition of Care [facility]) CM/SW Contact  Su Hilt, RN Phone Number: 09/22/2019, 11:49 AM  Clinical Narrative:    Spoke with the [facility] Crew chief to alert of the transport to [facility] most likely tomorrow, he stated that would be fine and try to get it called in before 3        Expected Discharge Plan and Services                                                 Social Determinants of Health (SDOH) Interventions    Readmission Risk Interventions No flowsheet data found.

## 2019-09-22 NOTE — Progress Notes (Signed)
Physical Therapy Treatment Patient Details Name: Alexis Jackson MRN: [id number] DOB: October 12, 1927 Today's Date: 09/22/2019    History of Present Illness Alexis Jackson is an 42yoF who comes to [facility] on 9/17 s/p fall at home, underwent HHA on Left femur to address femoral neck fracture. PMH: HTN, GAD, GERD, CAD, PAD, Dementia (no formal diagnosis per family), CABG, Left TKA, pulmonary fibrosis. PTA pt lived with Son and AMB with minGuard and SPC short distances within the home.    PT Comments    Pt in recliner, ready to walk.  Stood with min a x 1 and was able to progress gait this am to 79' with RW and min assist.  Flexed posture with poor hand placements with gait- too far forward off of handles despite education and cues to fix.  No LOB or buckling but fatigued with effort.  Remained in recliner after gai.   Follow Up Recommendations  SNF     Equipment Recommendations  Rolling walker with 5" wheels;3in1 (PT)    Recommendations for Other Services       Precautions / Restrictions Precautions Precautions: Posterior Hip;Fall Restrictions Weight Bearing Restrictions: Yes LLE Weight Bearing: Weight bearing as tolerated    Mobility  Bed Mobility Overal bed mobility: Needs Assistance Bed Mobility: Supine to Sit     Supine to sit: Min assist     General bed mobility comments: in recliner before and after  Transfers Overall transfer level: Needs assistance Equipment used: Rolling walker (2 wheeled) Transfers: Sit to/from Stand Sit to Stand: Min assist         General transfer comment: significant improvement today with +1 assist.  Ambulation/Gait Ambulation/Gait assistance: Min assist Gait Distance (Feet): 12 Feet Assistive device: Rolling walker (2 wheeled) Gait Pattern/deviations: Step-to pattern;Trunk flexed;Narrow base of support;Decreased step length - right;Decreased step length - left;Decreased stance time - left Gait velocity: decreased   General Gait Details:  Cues/encouragment for sequence/stepping   Stairs             Wheelchair Mobility    Modified Rankin (Stroke Patients Only)       Balance Overall balance assessment: Needs assistance;History of Falls Sitting-balance support: Feet supported Sitting balance-Leahy Scale: Fair     Standing balance support: Bilateral upper extremity supported Standing balance-Leahy Scale: Poor Standing balance comment: generally unsteady due to posture but no LOB or buckling                            Cognition Arousal/Alertness: Awake/alert Behavior During Therapy: WFL for tasks assessed/performed Overall Cognitive Status: History of cognitive impairments - at baseline                                 General Comments: more engaed today      Exercises Other Exercises Other Exercises: LLE AAROM - ankle pumps, heel slides, ab/add and SLR x 10    General Comments        Pertinent Vitals/Pain Pain Assessment: Faces Faces Pain Scale: Hurts a little bit Pain Location: L hip with movement Pain Descriptors / Indicators: Aching;Grimacing;Constant    Home Living                      Prior Function            PT Goals (current goals can now be found in the care plan section) Progress  towards PT goals: Progressing toward goals    Frequency    BID      PT Plan Current plan remains appropriate    Co-evaluation              AM-PAC PT "6 Clicks" Mobility   Outcome Measure  Help needed turning from your back to your side while in a flat bed without using bedrails?: A Little Help needed moving from lying on your back to sitting on the side of a flat bed without using bedrails?: A Lot Help needed moving to and from a bed to a chair (including a wheelchair)?: A Little Help needed standing up from a chair using your arms (e.g., wheelchair or bedside chair)?: A Little Help needed to walk in hospital room?: A Lot Help needed climbing 3-5  steps with a railing? : Total 6 Click Score: 14    End of Session Equipment Utilized During Treatment: Gait belt Activity Tolerance: Patient tolerated treatment well Patient left: in chair;with call bell/phone within reach;with chair alarm set;with family/visitor present;with nursing/sitter in room Nurse Communication: Mobility status       Time: [id number] PT Time Calculation (min) (ACUTE ONLY): 13 min  Charges:  $Gait Training: 8-22 mins $Therapeutic Exercise: 8-22 mins                    Chesley Noon, PTA 09/22/19, 11:25 AM

## 2019-09-22 NOTE — Progress Notes (Signed)
Subjective: 4 Days Post-Op Procedure(s) (LRB): ARTHROPLASTY BIPOLAR HIP (HEMIARTHROPLASTY) LEFT (Left) Patient reports some increased pain this morning while in bed, was able to walk a few steps yesterday with PT. Patient is well, and has had no acute complaints or problems Plan is for d/c to SNF when tolerated.   Negative for chest pain and shortness of breath Fever: no Gastrointestinal:Negative for nausea and vomiting  Objective: Vital signs in last 24 hours: Temp:  [97.6 F (36.4 C)-99.5 F (37.5 C)] 99 F (37.2 C) (09/21 2219) Pulse Rate:  [66-77] 75 (09/21 2219) Resp:  [16-18] 18 (09/21 2219) BP: (136-156)/(46-52) 141/52 (09/21 2219) SpO2:  [95 %-100 %] 95 % (09/21 2219)  Intake/Output from previous day:  Intake/Output Summary (Last 24 hours) at 09/22/2019 0729 Last data filed at 09/22/2019 0649 Gross per 24 hour  Intake 240 ml  Output 1250 ml  Net -1010 ml    Intake/Output this shift: No intake/output data recorded.  Labs: Recent Labs    09/20/19 0623 09/21/19 0514  HGB 9.1* 8.7*   Recent Labs    09/20/19 0623 09/21/19 0514  WBC 11.7* 9.7  RBC 3.01* 2.88*  HCT 28.1* 26.3*  PLT 146* 149*   No results for input(s): NA, K, CL, CO2, BUN, CREATININE, GLUCOSE, CALCIUM in the last 72 hours. No results for input(s): LABPT, INR in the last 72 hours.   EXAM General - Patient is Alert and Appropriate Extremity - ABD soft Neurovascular intact Sensation intact distally Intact pulses distally Dorsiflexion/Plantar flexion intact Incision: scant drainage No cellulitis present Dressing/Incision - blood tinged drainage, no erythema to the left hip. Motor Function - intact, moving foot and toes well on exam.   Past Medical History:  Diagnosis Date  . Benign essential tremor   . CAD (coronary artery disease)   . Dementia ([place])   . GAD (generalized anxiety disorder)   . GERD (gastroesophageal reflux disease)   . HLD (hyperlipidemia)   . Hypertension   . PAD  (peripheral artery disease) ([place])   . Pulmonary fibrosis (HCC)     Assessment/Plan: 4 Days Post-Op Procedure(s) (LRB): ARTHROPLASTY BIPOLAR HIP (HEMIARTHROPLASTY) LEFT (Left) Principal Problem:   Hip fracture (HCC) Active Problems:   HTN (hypertension)   GAD (generalized anxiety disorder)   GERD (gastroesophageal reflux disease)   CAD (coronary artery disease) Anemia due to acute blood loss  Estimated body mass index is 20.36 kg/m as calculated from the following:   Height as of this encounter: 5\' 7"  (1.702 m).   Weight as of this encounter: 59 kg. Advance diet Up with therapy   Patient is passing gas, small BM has been documented Foley has been removed. Follow-up at [facility] clinic in 2 weeks for staple removal. Upon d/c from the hospital, continue Lovenox 40mg  daily for 14 days.  DVT Prophylaxis - Lovenox and SCDs Weight-Bearing as tolerated to left leg  J. Cameron Proud, PA-C [facility] Orthopaedic Surgery 09/22/2019, 7:29 AM

## 2019-09-22 NOTE — Progress Notes (Signed)
[facility] [place] NAME: Alexis Jackson    MR#:  [id number]  DATE OF BIRTH:  1927-12-09  SUBJECTIVE:   Patient here after a fall and noted to have a left hip fracture.  S/p Left Hip Hemiarthroplasty  POD # 4 today.   NO acute events overnight. No chest pain and urinary retention.    REVIEW OF SYSTEMS:    Review of Systems  Constitutional: Negative for chills and fever.  HENT: Negative for congestion and tinnitus.   Eyes: Negative for blurred vision and double vision.  Respiratory: Negative for cough, shortness of breath and wheezing.   Cardiovascular: Negative for chest pain, orthopnea and PND.  Gastrointestinal: Negative for abdominal pain, diarrhea, nausea and vomiting.  Genitourinary: Negative for dysuria and hematuria.  Musculoskeletal: Positive for joint pain (Left Hip. ). Negative for falls.  Neurological: Negative for dizziness, sensory change and focal weakness.  All other systems reviewed and are negative.   Nutrition: Heart Healthy Tolerating Diet: Yes Tolerating PT: Eval noted.   DRUG ALLERGIES:   Allergies  Allergen Reactions  . Sulfa Antibiotics Rash    VITALS:  Blood pressure (!) 162/50, pulse 79, temperature 98.7 F (37.1 C), temperature source Oral, resp. rate 18, height 5\' 7"  (1.702 m), weight 59 kg, SpO2 95 %.  PHYSICAL EXAMINATION:   Physical Exam  GENERAL:  85 y.o.-year-old patient lying in bed in no acute distress.  EYES: Pupils equal, round, reactive to light and accommodation. No scleral icterus. Extraocular muscles intact.  HEENT: Head atraumatic, normocephalic. Oropharynx and nasopharynx clear.  NECK:  Supple, no jugular venous distention. No thyroid enlargement, no tenderness.  LUNGS: Normal breath sounds bilaterally, no wheezing, rales, rhonchi. No use of accessory muscles of respiration.  CARDIOVASCULAR: S1, S2 normal. No murmurs, rubs, or gallops.  ABDOMEN: Soft, nontender, nondistended.  Bowel sounds present. No organomegaly or mass.  EXTREMITIES: No cyanosis, clubbing, Left hip dressing in place with no acute bleeding or drainage noted and some surrounding bruising.  NEUROLOGIC: Cranial nerves II through XII are intact. No focal Motor or sensory deficits b/l. Globally weak.  PSYCHIATRIC: The patient is alert and oriented x 3.  SKIN: No obvious rash, lesion, or ulcer.    LABORATORY PANEL:   CBC Recent Labs  Lab 09/21/19 0514  WBC 9.7  HGB 8.7*  HCT 26.3*  PLT 149*   ------------------------------------------------------------------------------------------------------------------  Chemistries  Recent Labs  Lab 09/19/19 0548  NA 134*  K 3.9  CL 102  CO2 22  GLUCOSE 139*  BUN 31*  CREATININE 1.31*  CALCIUM 8.6*   ------------------------------------------------------------------------------------------------------------------  Cardiac Enzymes No results for input(s): TROPONINI in the last 168 hours. ------------------------------------------------------------------------------------------------------------------  RADIOLOGY:  No results found.   ASSESSMENT AND PLAN:   85 year old female with past medical history of dementia, pulmonary fibrosis, hypertension hyperlipidemia, GERD, status post bypass who presented to the hospital after mechanical fall noted to have a left hip fracture.  1.  Status post fall and left hip fracture- status post left hip hemiarthroplasty postop day #4 today. -Pain is well controlled.  Cont. PT as tolerated.  - Continue Lovenox for DVT prophylaxis - await Insurance authorization for SNF placement.   2. Essential HTN - BP stable.  - cont. Clonidine, Losartan/HCTZ.   -Heart rates have improved.  Will resume metoprolol at a lower dose.  3. Urinary Retention - improved.  Foley catheter was removed yesterday.  - cont. Flomax.   4. Anxiety - cont. Klonopin/Buspar.  5. Hyperlipidemia - cont. Pravachol  6. Neuropathy -  cont. Neurontin.   Discharge to short-term rehab tomorrow awaiting insurance authorization.  Will repeat COVID test in preparation for discharge.  All the records are reviewed and case discussed with Care Management/Social Worker. Management plans discussed with the patient, family and they are in agreement.  CODE STATUS: Full code   DVT Prophylaxis: Lovenox  TOTAL TIME TAKING CARE OF THIS PATIENT: 30 minutes.   POSSIBLE D/C IN 1-2 DAYS, DEPENDING ON CLINICAL CONDITION.   Henreitta Leber M.D on 09/22/2019 at 11:21 AM  Between 7am to 6pm - Pager - [phone]  After 6pm go to [email or web address] - [employer] [facility]  Office  [phone]  CC: Primary care physician; Ezequiel Kayser, MD

## 2019-09-23 DIAGNOSIS — I10 Essential (primary) hypertension: Secondary | ICD-10-CM | POA: Diagnosis not present

## 2019-09-23 DIAGNOSIS — E871 Hypo-osmolality and hyponatremia: Secondary | ICD-10-CM | POA: Diagnosis not present

## 2019-09-23 DIAGNOSIS — K8689 Other specified diseases of pancreas: Secondary | ICD-10-CM | POA: Diagnosis not present

## 2019-09-23 DIAGNOSIS — S3993XA Unspecified injury of pelvis, initial encounter: Secondary | ICD-10-CM | POA: Diagnosis not present

## 2019-09-23 DIAGNOSIS — S0990XA Unspecified injury of head, initial encounter: Secondary | ICD-10-CM | POA: Diagnosis not present

## 2019-09-23 DIAGNOSIS — G8911 Acute pain due to trauma: Secondary | ICD-10-CM | POA: Diagnosis not present

## 2019-09-23 DIAGNOSIS — S79922A Unspecified injury of left thigh, initial encounter: Secondary | ICD-10-CM | POA: Diagnosis not present

## 2019-09-23 DIAGNOSIS — F411 Generalized anxiety disorder: Secondary | ICD-10-CM | POA: Diagnosis not present

## 2019-09-23 DIAGNOSIS — S72009A Fracture of unspecified part of neck of unspecified femur, initial encounter for closed fracture: Secondary | ICD-10-CM | POA: Diagnosis not present

## 2019-09-23 DIAGNOSIS — E785 Hyperlipidemia, unspecified: Secondary | ICD-10-CM | POA: Diagnosis not present

## 2019-09-23 DIAGNOSIS — M2550 Pain in unspecified joint: Secondary | ICD-10-CM | POA: Diagnosis not present

## 2019-09-23 DIAGNOSIS — Z7401 Bed confinement status: Secondary | ICD-10-CM | POA: Diagnosis not present

## 2019-09-23 DIAGNOSIS — I2510 Atherosclerotic heart disease of native coronary artery without angina pectoris: Secondary | ICD-10-CM | POA: Diagnosis not present

## 2019-09-23 DIAGNOSIS — R402411 Glasgow coma scale score 13-15, in the field [EMT or ambulance]: Secondary | ICD-10-CM | POA: Diagnosis not present

## 2019-09-23 DIAGNOSIS — S79912A Unspecified injury of left hip, initial encounter: Secondary | ICD-10-CM | POA: Diagnosis not present

## 2019-09-23 DIAGNOSIS — M546 Pain in thoracic spine: Secondary | ICD-10-CM | POA: Diagnosis not present

## 2019-09-23 DIAGNOSIS — F0390 Unspecified dementia without behavioral disturbance: Secondary | ICD-10-CM | POA: Diagnosis not present

## 2019-09-23 DIAGNOSIS — S72002S Fracture of unspecified part of neck of left femur, sequela: Secondary | ICD-10-CM | POA: Diagnosis not present

## 2019-09-23 DIAGNOSIS — Y92129 Unspecified place in nursing home as the place of occurrence of the external cause: Secondary | ICD-10-CM | POA: Diagnosis not present

## 2019-09-23 DIAGNOSIS — Z96642 Presence of left artificial hip joint: Secondary | ICD-10-CM | POA: Diagnosis not present

## 2019-09-23 DIAGNOSIS — M6281 Muscle weakness (generalized): Secondary | ICD-10-CM | POA: Diagnosis not present

## 2019-09-23 DIAGNOSIS — R404 Transient alteration of awareness: Secondary | ICD-10-CM | POA: Diagnosis not present

## 2019-09-23 DIAGNOSIS — R112 Nausea with vomiting, unspecified: Secondary | ICD-10-CM | POA: Diagnosis not present

## 2019-09-23 DIAGNOSIS — R410 Disorientation, unspecified: Secondary | ICD-10-CM | POA: Diagnosis not present

## 2019-09-23 DIAGNOSIS — Z043 Encounter for examination and observation following other accident: Secondary | ICD-10-CM | POA: Diagnosis not present

## 2019-09-23 DIAGNOSIS — R52 Pain, unspecified: Secondary | ICD-10-CM | POA: Diagnosis not present

## 2019-09-23 DIAGNOSIS — I959 Hypotension, unspecified: Secondary | ICD-10-CM | POA: Diagnosis not present

## 2019-09-23 DIAGNOSIS — Z471 Aftercare following joint replacement surgery: Secondary | ICD-10-CM | POA: Diagnosis not present

## 2019-09-23 DIAGNOSIS — R339 Retention of urine, unspecified: Secondary | ICD-10-CM | POA: Diagnosis not present

## 2019-09-23 DIAGNOSIS — W19XXXS Unspecified fall, sequela: Secondary | ICD-10-CM | POA: Diagnosis not present

## 2019-09-23 DIAGNOSIS — N183 Chronic kidney disease, stage 3 (moderate): Secondary | ICD-10-CM | POA: Diagnosis not present

## 2019-09-23 DIAGNOSIS — I129 Hypertensive chronic kidney disease with stage 1 through stage 4 chronic kidney disease, or unspecified chronic kidney disease: Secondary | ICD-10-CM | POA: Diagnosis not present

## 2019-09-23 DIAGNOSIS — S3991XA Unspecified injury of abdomen, initial encounter: Secondary | ICD-10-CM | POA: Diagnosis not present

## 2019-09-23 DIAGNOSIS — F39 Unspecified mood [affective] disorder: Secondary | ICD-10-CM | POA: Diagnosis not present

## 2019-09-23 DIAGNOSIS — S199XXA Unspecified injury of neck, initial encounter: Secondary | ICD-10-CM | POA: Diagnosis not present

## 2019-09-23 DIAGNOSIS — S8992XA Unspecified injury of left lower leg, initial encounter: Secondary | ICD-10-CM | POA: Diagnosis not present

## 2019-09-23 DIAGNOSIS — R5381 Other malaise: Secondary | ICD-10-CM | POA: Diagnosis not present

## 2019-09-23 DIAGNOSIS — R58 Hemorrhage, not elsewhere classified: Secondary | ICD-10-CM | POA: Diagnosis not present

## 2019-09-23 DIAGNOSIS — R7989 Other specified abnormal findings of blood chemistry: Secondary | ICD-10-CM | POA: Diagnosis not present

## 2019-09-23 DIAGNOSIS — W1839XA Other fall on same level, initial encounter: Secondary | ICD-10-CM | POA: Diagnosis not present

## 2019-09-23 DIAGNOSIS — W19XXXA Unspecified fall, initial encounter: Secondary | ICD-10-CM | POA: Diagnosis not present

## 2019-09-23 DIAGNOSIS — G629 Polyneuropathy, unspecified: Secondary | ICD-10-CM | POA: Diagnosis not present

## 2019-09-23 LAB — CREATININE, SERUM
Creatinine, Ser: 0.99 mg/dL (ref 0.44–1.00)
GFR calc Af Amer: 59 mL/min — ABNORMAL LOW (ref >60)
GFR calc non Af Amer: 51 mL/min — ABNORMAL LOW (ref >60)

## 2019-09-23 LAB — CBC
HCT: 27.7 % — ABNORMAL LOW (ref 36.0–46.0)
Hemoglobin: 9.2 g/dL — ABNORMAL LOW (ref 12.0–15.0)
MCH: 30.1 pg (ref 26.0–34.0)
MCHC: 33.2 g/dL (ref 30.0–36.0)
MCV: 90.5 fL (ref 80.0–100.0)
Platelets: 213 10*3/uL (ref 150–400)
RBC: 3.06 MIL/uL — ABNORMAL LOW (ref 3.87–5.11)
RDW: 12.9 % (ref 11.5–15.5)
WBC: 9.2 10*3/uL (ref 4.0–10.5)
nRBC: 0 % (ref 0.0–0.2)

## 2019-09-23 LAB — SARS CORONAVIRUS 2 (TAT 6-24 HRS): SARS Coronavirus 2: NEGATIVE

## 2019-09-23 MED ORDER — PROPRANOLOL HCL 10 MG PO TABS: 10.0000 mg | ORAL_TABLET | Freq: Two times a day (BID) | ORAL | Status: AC

## 2019-09-23 MED ORDER — TAMSULOSIN HCL 0.4 MG PO CAPS: 0.4000 mg | ORAL_CAPSULE | Freq: Every day | ORAL | Status: DC

## 2019-09-23 NOTE — TOC Transition Note (Signed)
Transition of Care [facility]) - CM/SW Discharge Note   Patient Details  Name: Alexis Jackson MRN: [id number] Date of Birth: 1927/02/22  Transition of Care [facility]) CM/SW Contact:  Su Hilt, RN Phone Number: 09/23/2019, 1:30 PM   Clinical Narrative:     [facility] and he gave the okay to send the patient.  He requested the Medicare number, I spoke with the son [name] to obtain the Medicare number and all he has is the [employer] number, I called and left a detailed secure VM for Dough at the [place] [facility] that I do not have the Medicare number but could provide the SS number. Explained to the bedside nurse that the EMS needs to be notified ASAP do to it being in [place].        Patient Goals and CMS Choice        Discharge Placement                       Discharge Plan and Services                                     Social Determinants of Health (SDOH) Interventions     Readmission Risk Interventions No flowsheet data found.

## 2019-09-23 NOTE — Plan of Care (Signed)
Talked with Son and pt at bedside. Pt has dementia and needs reminding often. Verbailizes understanding but not sure what she understands. Son acknowledges care plan.

## 2019-09-23 NOTE — Care Management Important Message (Signed)
Important Message  Patient Details  Name: Alexis Jackson MRN: [id number] Date of Birth: 10-29-1927   Medicare Important Message Given:  Yes     [name] A Georgette Helmer 09/23/2019, 11:00 AM

## 2019-09-23 NOTE — Progress Notes (Signed)
Report given to [facility].

## 2019-09-23 NOTE — Progress Notes (Signed)
Subjective: 5 Days Post-Op Procedure(s) (LRB): ARTHROPLASTY BIPOLAR HIP (HEMIARTHROPLASTY) LEFT (Left) Patient reports no significant pain this morning, was able to walk 12 feet yesterday. Patient is well, and has had no acute complaints or problems Plan is for d/c to SNF when tolerated.   Negative for chest pain and shortness of breath Fever: no Gastrointestinal:Negative for nausea and vomiting  Objective: Vital signs in last 24 hours: Temp:  [98 F (36.7 C)-98.9 F (37.2 C)] 98 F (36.7 C) (09/23 0726) Pulse Rate:  [67-79] 67 (09/23 0726) Resp:  [18] 18 (09/23 0726) BP: (119-162)/(50-55) 140/52 (09/23 0726) SpO2:  [95 %-96 %] 96 % (09/23 0726)  Intake/Output from previous day:  Intake/Output Summary (Last 24 hours) at 09/23/2019 0744 Last data filed at 09/23/2019 0212 Gross per 24 hour  Intake 480 ml  Output 1000 ml  Net -520 ml    Intake/Output this shift: No intake/output data recorded.  Labs: Recent Labs    09/21/19 0514 09/23/19 0446  HGB 8.7* 9.2*   Recent Labs    09/21/19 0514 09/23/19 0446  WBC 9.7 9.2  RBC 2.88* 3.06*  HCT 26.3* 27.7*  PLT 149* 213   Recent Labs    09/23/19 0446  CREATININE 0.99   No results for input(s): LABPT, INR in the last 72 hours.   EXAM General - Patient is Alert and Appropriate Extremity - ABD soft Neurovascular intact Sensation intact distally Intact pulses distally Dorsiflexion/Plantar flexion intact Incision: scant drainage No cellulitis present Dressing/Incision - blood tinged drainage, no erythema to the left hip. Motor Function - intact, moving foot and toes well on exam.   Past Medical History:  Diagnosis Date  . Benign essential tremor   . CAD (coronary artery disease)   . Dementia ([place])   . GAD (generalized anxiety disorder)   . GERD (gastroesophageal reflux disease)   . HLD (hyperlipidemia)   . Hypertension   . PAD (peripheral artery disease) ([place])   . Pulmonary fibrosis (HCC)      Assessment/Plan: 5 Days Post-Op Procedure(s) (LRB): ARTHROPLASTY BIPOLAR HIP (HEMIARTHROPLASTY) LEFT (Left) Principal Problem:   Hip fracture (HCC) Active Problems:   HTN (hypertension)   GAD (generalized anxiety disorder)   GERD (gastroesophageal reflux disease)   CAD (coronary artery disease) Anemia due to acute blood loss  Estimated body mass index is 20.36 kg/m as calculated from the following:   Height as of this encounter: 5\' 7"  (1.702 m).   Weight as of this encounter: 59 kg. Advance diet Up with therapy   Labs reviewed this AM. Patient is passing gas, small BM has been documented  Staple will need to be removed on 10/02/19.  Can be removed by SNF or follow-up with [facility].  If staples removed by SNF, follow-up with [facility] in 6 weeks for x-rays of the left hip. Upon d/c from the hospital, continue Lovenox 40mg  daily for 14 days.  DVT Prophylaxis - Lovenox and SCDs Weight-Bearing as tolerated to left leg  J. Cameron Proud, PA-C [facility] Orthopaedic Surgery 09/23/2019, 7:44 AM

## 2019-09-23 NOTE — Progress Notes (Signed)
Physical Therapy Treatment Patient Details Name: Alexis Jackson MRN: [id number] DOB: 1927-12-07 Today's Date: 09/23/2019    History of Present Illness Alexis Jackson is an 25yoF who comes to [facility] on 9/17 s/p fall at home, underwent HHA on Left femur to address femoral neck fracture. PMH: HTN, GAD, GERD, CAD, PAD, Dementia (no formal diagnosis per family), CABG, Left TKA, pulmonary fibrosis. PTA pt lived with Son and AMB with minGuard and SPC short distances within the home.    PT Comments    Patient laying flat in bed at start of session, had just received a bath. Pt fatigued/tired but willing to participate with PT. Endorsed L leg pain/back pain as 7/10 after mobility session, requested pain medications, PT informed nursing staff. Overall the patient demonstrated progress towards goals. Improved ability to participate in therapeutic exercises for LLE. Supine to sit minA for complete trunk elevation and weight shifts. Sit <> stand with RW and CGA, and pt was able to ambulate to recliner in room with CGA as well. Pt requested to rest/appeared fatigued further mobility held. The patient would benefit from further skilled PT to continue to progress towards goals and return to PLOF as able.   Follow Up Recommendations  SNF     Equipment Recommendations  Rolling walker with 5" wheels;3in1 (PT)    Recommendations for Other Services       Precautions / Restrictions Precautions Precautions: Posterior Hip;Fall Restrictions Weight Bearing Restrictions: Yes LLE Weight Bearing: Weight bearing as tolerated    Mobility  Bed Mobility Overal bed mobility: Needs Assistance Bed Mobility: Supine to Sit     Supine to sit: Min assist        Transfers Overall transfer level: Needs assistance Equipment used: Rolling walker (2 wheeled) Transfers: Sit to/from Stand Sit to Stand: Min guard;From elevated surface         General transfer comment: Cues for hand placement to increase safety, mildly  elevated bed surface  Ambulation/Gait Ambulation/Gait assistance: Min guard Gait Distance (Feet): 2 Feet Assistive device: Rolling walker (2 wheeled)       General Gait Details: Pt deferred further ambulation at this time due to fatigue. PT agreeable due to pt busy AM (meal, BSC transfers with nursing staff, very recent bath)   Stairs             Wheelchair Mobility    Modified Rankin (Stroke Patients Only)       Balance Overall balance assessment: Needs assistance;History of Falls Sitting-balance support: Feet supported Sitting balance-Leahy Scale: Fair       Standing balance-Leahy Scale: Poor Standing balance comment: Pt reliant on RW for support, no physical assist needed this session                            Cognition Arousal/Alertness: Awake/alert Behavior During Therapy: WFL for tasks assessed/performed Overall Cognitive Status: History of cognitive impairments - at baseline                                 General Comments: some delay in answering questions but able to answer appropriately      Exercises Total Joint Exercises Short Arc Quad: AROM;Left;15 reps;Strengthening;Right;10 reps Heel Slides: AROM;Strengthening;Both;10 reps Hip ABduction/ADduction: AAROM;Strengthening;Left;10 reps;AROM;Right    General Comments        Pertinent Vitals/Pain Pain Assessment: 0-10 Pain Score: 7  Pain Location: L hip/back after mobility Pain  Descriptors / Indicators: Aching;Grimacing;Constant;Moaning Pain Intervention(s): Limited activity within patient's tolerance;Monitored during session;Premedicated before session;Repositioned;Patient requesting pain meds-RN notified    Home Living                      Prior Function            PT Goals (current goals can now be found in the care plan section) Progress towards PT goals: Progressing toward goals    Frequency    BID      PT Plan Current plan remains  appropriate    Co-evaluation              AM-PAC PT "6 Clicks" Mobility   Outcome Measure  Help needed turning from your back to your side while in a flat bed without using bedrails?: A Little Help needed moving from lying on your back to sitting on the side of a flat bed without using bedrails?: A Lot Help needed moving to and from a bed to a chair (including a wheelchair)?: A Little Help needed standing up from a chair using your arms (e.g., wheelchair or bedside chair)?: A Little Help needed to walk in hospital room?: A Little Help needed climbing 3-5 steps with a railing? : Total 6 Click Score: 15    End of Session Equipment Utilized During Treatment: Gait belt Activity Tolerance: Patient limited by fatigue;Patient limited by pain Patient left: in chair;with call bell/phone within reach;with chair alarm set;with family/visitor present;with nursing/sitter in room Nurse Communication: Mobility status PT Visit Diagnosis: Unsteadiness on feet (R26.81);Other abnormalities of gait and mobility (R26.89);Difficulty in walking, not elsewhere classified (R26.2);Muscle weakness (generalized) (M62.81);History of falling (Z91.81);Other symptoms and signs involving the nervous system (R29.898)     Time: [id number] PT Time Calculation (min) (ACUTE ONLY): 24 min  Charges:  $Therapeutic Exercise: 23-37 mins                     Lieutenant Diego PT, DPT 10:40 AM,09/23/19 [phone]

## 2019-09-23 NOTE — Discharge Summary (Signed)
[place] [facility] [place] NAME: Alexis Jackson    MR#:  [id number]  DATE OF BIRTH:  May 26, 1927  DATE OF ADMISSION:  09/17/2019 ADMITTING PHYSICIAN: Lance Coon, MD  DATE OF DISCHARGE: 09/23/2019  PRIMARY CARE PHYSICIAN: Ezequiel Kayser, MD    ADMISSION DIAGNOSIS:  Fall [W19.XXXA] Pre-op chest exam [id number] Fall, initial encounter [W19.XXXA] Closed fracture of neck of left femur, initial encounter ([place]) [S72.002A]  DISCHARGE DIAGNOSIS:  Principal Problem:   Hip fracture ([place]) Active Problems:   HTN (hypertension)   GAD (generalized anxiety disorder)   GERD (gastroesophageal reflux disease)   CAD (coronary artery disease)   SECONDARY DIAGNOSIS:   Past Medical History:  Diagnosis Date  . Benign essential tremor   . CAD (coronary artery disease)   . Dementia ([place])   . GAD (generalized anxiety disorder)   . GERD (gastroesophageal reflux disease)   . HLD (hyperlipidemia)   . Hypertension   . PAD (peripheral artery disease) ([place])   . Pulmonary fibrosis [facility])     HOSPITAL COURSE:   85 year old female with past medical history of dementia, pulmonary fibrosis, hypertension hyperlipidemia, GERD, status post bypass who presented to the hospital after mechanical fall noted to have a left hip fracture.  1.  Status post fall and left hip fracture- status post left hip hemiarthroplasty postop day #5 today. -Patient is pain is well controlled on oral oxycodone.  She is tolerating physical therapy well. -She will continue Lovenox for DVT prophylaxis.  She will follow-up with orthopedic surgeon in the next 2 weeks. -Patient's honeycomb dressing on the left hip shows no evidence of acute drainage or bleeding.  2. Essential HTN -patient was maintained on her losartan HCTZ, clonidine and also her propranolol. - She was a bit bradycardic therefore her propranolol dose has been lowered to twice a day. - Blood pressure currently stable and she will be  discharged on a regimen as stated below.  3. Urinary Retention -patient developed some urinary retention postoperatively.  A Foley catheter was placed.  Patient was started on some Flomax, Foley catheter has now been removed.  She is voiding well. -She can continue the Flomax for now which can probably be stopped in the near future.  If she continues to have urinary retention she may need referral to urology as an outpatient.    4. Anxiety - pt. Will cont. Klonopin/Buspar.   5. Hyperlipidemia - pt. Will cont. Pravachol  6. Neuropathy - pt. Will cont. Neurontin.   DISCHARGE CONDITIONS:   Stable.   CONSULTS OBTAINED:  Treatment Team:  Teodoro Spray, MD Leim Fabry, MD  DRUG ALLERGIES:   Allergies  Allergen Reactions  . Sulfa Antibiotics Rash    DISCHARGE MEDICATIONS:   Allergies as of 09/23/2019      Reactions   Sulfa Antibiotics Rash      Medication List    TAKE these medications   amLODipine 2.5 MG tablet Commonly known as: NORVASC Take 2.5 mg by mouth daily.   aspirin EC 81 MG tablet Take 81 mg by mouth 3 (three) times a week.   busPIRone 5 MG tablet Commonly known as: BUSPAR Take 5 mg by mouth 2 (two) times daily.   clonazePAM 0.5 MG tablet Commonly known as: KLONOPIN Take 0.5 mg by mouth at bedtime as needed for anxiety.   cloNIDine 0.1 MG tablet Commonly known as: CATAPRES Take 0.1 mg by mouth as needed (BP >170).   enoxaparin 40 MG/0.4ML injection Commonly known  as: LOVENOX Inject 0.4 mLs (40 mg total) into the skin daily for 14 doses.   famotidine 40 MG tablet Commonly known as: PEPCID Take 40 mg by mouth at bedtime.   gabapentin 100 MG capsule Commonly known as: NEURONTIN Take 200 mg by mouth 3 (three) times daily.   losartan-hydrochlorothiazide 100-12.5 MG tablet Commonly known as: HYZAAR Take 1 tablet by mouth daily.   ondansetron 4 MG disintegrating tablet Commonly known as: ZOFRAN-ODT Take 4 mg by mouth daily as needed for  nausea.   oxyCODONE 5 MG immediate release tablet Commonly known as: Oxy IR/ROXICODONE Take 0.5-1 tablets (2.5-5 mg total) by mouth every 4 (four) hours as needed for moderate pain (pain score 4-6).   pravastatin 20 MG tablet Commonly known as: PRAVACHOL Take 20 mg by mouth every evening.   propranolol 10 MG tablet Commonly known as: INDERAL Take 1 tablet (10 mg total) by mouth 2 (two) times daily. What changed: when to take this   tamsulosin 0.4 MG Caps capsule Commonly known as: FLOMAX Take 1 capsule (0.4 mg total) by mouth daily. Start taking on: September 24, 2019         DISCHARGE INSTRUCTIONS:   DIET:  Cardiac diet  DISCHARGE CONDITION:  Stable  ACTIVITY:  Activity as tolerated  OXYGEN:  Home Oxygen: No.   Oxygen Delivery: room air  DISCHARGE LOCATION:  nursing home   If you experience worsening of your admission symptoms, develop shortness of breath, life threatening emergency, suicidal or homicidal thoughts you must seek medical attention immediately by calling 911 or calling your MD immediately  if symptoms less severe.  You Must read complete instructions/literature along with all the possible adverse reactions/side effects for all the Medicines you take and that have been prescribed to you. Take any new Medicines after you have completely understood and accpet all the possible adverse reactions/side effects.   Please note  You were cared for by a hospitalist during your hospital stay. If you have any questions about your discharge medications or the care you received while you were in the hospital after you are discharged, you can call the unit and asked to speak with the hospitalist on call if the hospitalist that took care of you is not available. Once you are discharged, your primary care physician will handle any further medical issues. Please note that NO REFILLS for any discharge medications will be authorized once you are discharged, as it is  imperative that you return to your primary care physician (or establish a relationship with a primary care physician if you do not have one) for your aftercare needs so that they can reassess your need for medications and monitor your lab values.     Today   No acute events overnight.  Patient denies any chest pains nausea or vomiting.  Tolerating PT well.  Insurance approved for short-term rehab received.  Will discharge there today.  VITAL SIGNS:  Blood pressure (!) 140/52, pulse 67, temperature 98 F (36.7 C), resp. rate 18, height 5\' 7"  (1.702 m), weight 59 kg, SpO2 96 %.  I/O:    Intake/Output Summary (Last 24 hours) at 09/23/2019 0956 Last data filed at 09/23/2019 0212 Gross per 24 hour  Intake 480 ml  Output 1000 ml  Net -520 ml    PHYSICAL EXAMINATION:   GENERAL:  85 y.o.-year-old patient lying in bed in no acute distress.  EYES: Pupils equal, round, reactive to light and accommodation. No scleral icterus. Extraocular muscles intact.  HEENT:  Head atraumatic, normocephalic. Oropharynx and nasopharynx clear.  NECK:  Supple, no jugular venous distention. No thyroid enlargement, no tenderness.  LUNGS: Normal breath sounds bilaterally, no wheezing, rales, rhonchi. No use of accessory muscles of respiration.  CARDIOVASCULAR: S1, S2 normal. No murmurs, rubs, or gallops.  ABDOMEN: Soft, nontender, nondistended. Bowel sounds present. No organomegaly or mass.  EXTREMITIES: No cyanosis, clubbing, left hip honeycomb dressing in place with no acute bleeding or drainage noted.  Some bruising around the surgical site.   NEUROLOGIC: Cranial nerves II through XII are intact. No focal Motor or sensory deficits b/l. Globally weak.  PSYCHIATRIC: The patient is alert and oriented x 3.  SKIN: No obvious rash, lesion, or ulcer.   DATA REVIEW:   CBC Recent Labs  Lab 09/23/19 0446  WBC 9.2  HGB 9.2*  HCT 27.7*  PLT 213    Chemistries  Recent Labs  Lab 09/19/19 0548 09/23/19 0446   NA 134*  --   K 3.9  --   CL 102  --   CO2 22  --   GLUCOSE 139*  --   BUN 31*  --   CREATININE 1.31* 0.99  CALCIUM 8.6*  --     Cardiac Enzymes No results for input(s): TROPONINI in the last 168 hours.  Microbiology Results  Results for orders placed or performed during the hospital encounter of 09/17/19  SARS Coronavirus 2 [facility] order, Performed in [facility] hospital lab) Nasopharyngeal Nasopharyngeal Swab     Status: None   Collection Time: 09/17/19  9:52 PM   Specimen: Nasopharyngeal Swab  Result Value Ref Range Status   SARS Coronavirus 2 NEGATIVE NEGATIVE Final    Comment: (NOTE) If result is NEGATIVE SARS-CoV-2 target nucleic acids are NOT DETECTED. The SARS-CoV-2 RNA is generally detectable in upper and lower  respiratory specimens during the acute phase of infection. The lowest  concentration of SARS-CoV-2 viral copies this assay can detect is 250  copies / mL. A negative result does not preclude SARS-CoV-2 infection  and should not be used as the sole basis for treatment or other  patient management decisions.  A negative result may occur with  improper specimen collection / handling, submission of specimen other  than nasopharyngeal swab, presence of viral mutation(s) within the  areas targeted by this assay, and inadequate number of viral copies  (<250 copies / mL). A negative result must be combined with clinical  observations, patient history, and epidemiological information. If result is POSITIVE SARS-CoV-2 target nucleic acids are DETECTED. The SARS-CoV-2 RNA is generally detectable in upper and lower  respiratory specimens dur ing the acute phase of infection.  Positive  results are indicative of active infection with SARS-CoV-2.  Clinical  correlation with patient history and other diagnostic information is  necessary to determine patient infection status.  Positive results do  not rule out bacterial infection or co-infection with other viruses. If  result is PRESUMPTIVE POSTIVE SARS-CoV-2 nucleic acids MAY BE PRESENT.   A presumptive positive result was obtained on the submitted specimen  and confirmed on repeat testing.  While 2019 novel coronavirus  (SARS-CoV-2) nucleic acids may be present in the submitted sample  additional confirmatory testing may be necessary for epidemiological  and / or clinical management purposes  to differentiate between  SARS-CoV-2 and other Sarbecovirus currently known to infect humans.  If clinically indicated additional testing with an alternate test  methodology [phone]) is advised. The SARS-CoV-2 RNA is generally  detectable in upper and lower  respiratory sp ecimens during the acute  phase of infection. The expected result is Negative. Fact Sheet for Patients:  StrictlyIdeas.no Fact Sheet for Healthcare Providers: BankingDealers.co.za This test is not yet approved or cleared by the [place] FDA and has been authorized for detection and/or diagnosis of SARS-CoV-2 by FDA under an Emergency Use Authorization (EUA).  This EUA will remain in effect (meaning this test can be used) for the duration of the COVID-19 declaration under Section 564(b)(1) of the Act, 21 U.S.C. section 360bbb-3(b)(1), unless the authorization is terminated or revoked sooner. Performed at [facility], [address], [place], [place] [address]   Surgical pcr screen     Status: Abnormal   Collection Time: 09/18/19  1:20 AM   Specimen: Nasal Mucosa; Nasal Swab  Result Value Ref Range Status   MRSA, PCR POSITIVE (A) NEGATIVE Final    Comment: RESULT CALLED TO, READ BACK BY AND VERIFIED WITH: KAREEMA HARRIS @0304  09/18/19 MJU    Staphylococcus aureus POSITIVE (A) NEGATIVE Final    Comment: (NOTE) The Xpert SA Assay (FDA approved for NASAL specimens in patients 67 years of age and older), is one component of a comprehensive surveillance program. It is not intended  to diagnose infection nor to guide or monitor treatment. Performed at [facility], [place], [place] [address]   SARS CORONAVIRUS 2 (TAT 6-24 HRS) Nasopharyngeal Nasopharyngeal Swab     Status: None   Collection Time: 09/22/19  5:10 PM   Specimen: Nasopharyngeal Swab  Result Value Ref Range Status   SARS Coronavirus 2 NEGATIVE NEGATIVE Final    Comment: (NOTE) SARS-CoV-2 target nucleic acids are NOT DETECTED. The SARS-CoV-2 RNA is generally detectable in upper and lower respiratory specimens during the acute phase of infection. Negative results do not preclude SARS-CoV-2 infection, do not rule out co-infections with other pathogens, and should not be used as the sole basis for treatment or other patient management decisions. Negative results must be combined with clinical observations, patient history, and epidemiological information. The expected result is Negative. Fact Sheet for Patients: SugarRoll.be Fact Sheet for Healthcare Providers: https://www.woods-mathews.com/ This test is not yet approved or cleared by the [place] FDA and  has been authorized for detection and/or diagnosis of SARS-CoV-2 by FDA under an Emergency Use Authorization (EUA). This EUA will remain  in effect (meaning this test can be used) for the duration of the COVID-19 declaration under Section 56 4(b)(1) of the Act, 21 U.S.C. section 360bbb-3(b)(1), unless the authorization is terminated or revoked sooner. Performed at [facility], [place] [address] [place], [place] [address]     RADIOLOGY:  No results found.    Management plans discussed with the patient, family and they are in agreement.  CODE STATUS:     Code Status Orders  (From admission, onward)         Start     Ordered   09/18/19 0032  Full code  Continuous     09/18/19 0032        Code Status History    This patient has a current code status but no  historical code status.   Advance Care Planning Activity      TOTAL TIME TAKING CARE OF THIS PATIENT: 40 minutes.    Henreitta Leber M.D on 09/23/2019 at 9:56 AM  Between 7am to 6pm - Pager - [phone]  After 6pm go to [email or web address] - [employer] [facility]  Office  [phone]  CC: Primary care physician; Ezequiel Kayser, MD

## 2019-09-24 DIAGNOSIS — R5381 Other malaise: Secondary | ICD-10-CM | POA: Diagnosis not present

## 2019-09-25 DIAGNOSIS — R5381 Other malaise: Secondary | ICD-10-CM | POA: Diagnosis not present

## 2019-09-27 DIAGNOSIS — S79922A Unspecified injury of left thigh, initial encounter: Secondary | ICD-10-CM | POA: Diagnosis not present

## 2019-09-27 DIAGNOSIS — E785 Hyperlipidemia, unspecified: Secondary | ICD-10-CM | POA: Diagnosis not present

## 2019-09-27 DIAGNOSIS — S3991XA Unspecified injury of abdomen, initial encounter: Secondary | ICD-10-CM | POA: Diagnosis not present

## 2019-09-27 DIAGNOSIS — I2510 Atherosclerotic heart disease of native coronary artery without angina pectoris: Secondary | ICD-10-CM | POA: Diagnosis not present

## 2019-09-27 DIAGNOSIS — R7989 Other specified abnormal findings of blood chemistry: Secondary | ICD-10-CM | POA: Diagnosis not present

## 2019-09-27 DIAGNOSIS — G8911 Acute pain due to trauma: Secondary | ICD-10-CM | POA: Diagnosis not present

## 2019-09-27 DIAGNOSIS — E871 Hypo-osmolality and hyponatremia: Secondary | ICD-10-CM | POA: Diagnosis not present

## 2019-09-27 DIAGNOSIS — I129 Hypertensive chronic kidney disease with stage 1 through stage 4 chronic kidney disease, or unspecified chronic kidney disease: Secondary | ICD-10-CM | POA: Diagnosis not present

## 2019-09-27 DIAGNOSIS — W1839XA Other fall on same level, initial encounter: Secondary | ICD-10-CM | POA: Diagnosis not present

## 2019-09-27 DIAGNOSIS — I10 Essential (primary) hypertension: Secondary | ICD-10-CM | POA: Diagnosis not present

## 2019-09-27 DIAGNOSIS — S79912A Unspecified injury of left hip, initial encounter: Secondary | ICD-10-CM | POA: Diagnosis not present

## 2019-09-27 DIAGNOSIS — K838 Other specified diseases of biliary tract: Secondary | ICD-10-CM | POA: Diagnosis not present

## 2019-09-27 DIAGNOSIS — R402411 Glasgow coma scale score 13-15, in the field [EMT or ambulance]: Secondary | ICD-10-CM | POA: Diagnosis not present

## 2019-09-27 DIAGNOSIS — F0390 Unspecified dementia without behavioral disturbance: Secondary | ICD-10-CM | POA: Diagnosis not present

## 2019-09-27 DIAGNOSIS — S199XXA Unspecified injury of neck, initial encounter: Secondary | ICD-10-CM | POA: Diagnosis not present

## 2019-09-27 DIAGNOSIS — R58 Hemorrhage, not elsewhere classified: Secondary | ICD-10-CM | POA: Diagnosis not present

## 2019-09-27 DIAGNOSIS — S3993XA Unspecified injury of pelvis, initial encounter: Secondary | ICD-10-CM | POA: Diagnosis not present

## 2019-09-27 DIAGNOSIS — F411 Generalized anxiety disorder: Secondary | ICD-10-CM | POA: Diagnosis not present

## 2019-09-27 DIAGNOSIS — Y92129 Unspecified place in nursing home as the place of occurrence of the external cause: Secondary | ICD-10-CM | POA: Diagnosis not present

## 2019-09-27 DIAGNOSIS — N183 Chronic kidney disease, stage 3 (moderate): Secondary | ICD-10-CM | POA: Diagnosis not present

## 2019-09-27 DIAGNOSIS — N179 Acute kidney failure, unspecified: Secondary | ICD-10-CM | POA: Diagnosis not present

## 2019-09-27 DIAGNOSIS — K219 Gastro-esophageal reflux disease without esophagitis: Secondary | ICD-10-CM | POA: Diagnosis not present

## 2019-09-27 DIAGNOSIS — R410 Disorientation, unspecified: Secondary | ICD-10-CM | POA: Diagnosis not present

## 2019-09-27 DIAGNOSIS — S8992XA Unspecified injury of left lower leg, initial encounter: Secondary | ICD-10-CM | POA: Diagnosis not present

## 2019-09-27 DIAGNOSIS — F418 Other specified anxiety disorders: Secondary | ICD-10-CM | POA: Diagnosis not present

## 2019-09-27 DIAGNOSIS — R404 Transient alteration of awareness: Secondary | ICD-10-CM | POA: Diagnosis not present

## 2019-09-27 DIAGNOSIS — R112 Nausea with vomiting, unspecified: Secondary | ICD-10-CM | POA: Diagnosis not present

## 2019-09-27 DIAGNOSIS — S7012XA Contusion of left thigh, initial encounter: Secondary | ICD-10-CM | POA: Diagnosis not present

## 2019-09-27 DIAGNOSIS — R52 Pain, unspecified: Secondary | ICD-10-CM | POA: Diagnosis not present

## 2019-09-27 DIAGNOSIS — W19XXXA Unspecified fall, initial encounter: Secondary | ICD-10-CM | POA: Diagnosis not present

## 2019-09-27 DIAGNOSIS — Z96642 Presence of left artificial hip joint: Secondary | ICD-10-CM | POA: Diagnosis not present

## 2019-09-27 DIAGNOSIS — S0990XA Unspecified injury of head, initial encounter: Secondary | ICD-10-CM | POA: Diagnosis not present

## 2019-09-27 DIAGNOSIS — K8689 Other specified diseases of pancreas: Secondary | ICD-10-CM | POA: Diagnosis not present

## 2019-09-27 DIAGNOSIS — Z043 Encounter for examination and observation following other accident: Secondary | ICD-10-CM | POA: Diagnosis not present

## 2019-09-27 DIAGNOSIS — M546 Pain in thoracic spine: Secondary | ICD-10-CM | POA: Diagnosis not present

## 2019-09-28 DIAGNOSIS — R112 Nausea with vomiting, unspecified: Secondary | ICD-10-CM | POA: Diagnosis not present

## 2019-09-28 DIAGNOSIS — W1839XA Other fall on same level, initial encounter: Secondary | ICD-10-CM | POA: Diagnosis not present

## 2019-09-28 DIAGNOSIS — K838 Other specified diseases of biliary tract: Secondary | ICD-10-CM | POA: Diagnosis not present

## 2019-09-28 DIAGNOSIS — I10 Essential (primary) hypertension: Secondary | ICD-10-CM | POA: Diagnosis not present

## 2019-09-28 DIAGNOSIS — N179 Acute kidney failure, unspecified: Secondary | ICD-10-CM | POA: Diagnosis not present

## 2019-09-28 DIAGNOSIS — G8911 Acute pain due to trauma: Secondary | ICD-10-CM | POA: Diagnosis not present

## 2019-09-28 DIAGNOSIS — F418 Other specified anxiety disorders: Secondary | ICD-10-CM | POA: Diagnosis not present

## 2019-09-28 DIAGNOSIS — I2510 Atherosclerotic heart disease of native coronary artery without angina pectoris: Secondary | ICD-10-CM | POA: Diagnosis not present

## 2019-09-28 DIAGNOSIS — K219 Gastro-esophageal reflux disease without esophagitis: Secondary | ICD-10-CM | POA: Diagnosis not present

## 2019-09-29 DIAGNOSIS — G8911 Acute pain due to trauma: Secondary | ICD-10-CM | POA: Diagnosis not present

## 2019-09-29 DIAGNOSIS — N179 Acute kidney failure, unspecified: Secondary | ICD-10-CM | POA: Diagnosis not present

## 2019-09-29 DIAGNOSIS — R112 Nausea with vomiting, unspecified: Secondary | ICD-10-CM | POA: Diagnosis not present

## 2019-09-29 DIAGNOSIS — K838 Other specified diseases of biliary tract: Secondary | ICD-10-CM | POA: Diagnosis not present

## 2019-09-29 DIAGNOSIS — I2510 Atherosclerotic heart disease of native coronary artery without angina pectoris: Secondary | ICD-10-CM | POA: Diagnosis not present

## 2019-09-29 DIAGNOSIS — F418 Other specified anxiety disorders: Secondary | ICD-10-CM | POA: Diagnosis not present

## 2019-09-29 DIAGNOSIS — I10 Essential (primary) hypertension: Secondary | ICD-10-CM | POA: Diagnosis not present

## 2019-09-29 DIAGNOSIS — W1839XA Other fall on same level, initial encounter: Secondary | ICD-10-CM | POA: Diagnosis not present

## 2019-09-29 DIAGNOSIS — K219 Gastro-esophageal reflux disease without esophagitis: Secondary | ICD-10-CM | POA: Diagnosis not present

## 2019-09-30 DIAGNOSIS — I2510 Atherosclerotic heart disease of native coronary artery without angina pectoris: Secondary | ICD-10-CM | POA: Diagnosis not present

## 2019-09-30 DIAGNOSIS — Z96642 Presence of left artificial hip joint: Secondary | ICD-10-CM | POA: Diagnosis not present

## 2019-09-30 DIAGNOSIS — I10 Essential (primary) hypertension: Secondary | ICD-10-CM | POA: Diagnosis not present

## 2019-09-30 DIAGNOSIS — W19XXXA Unspecified fall, initial encounter: Secondary | ICD-10-CM | POA: Diagnosis not present

## 2019-09-30 DIAGNOSIS — K219 Gastro-esophageal reflux disease without esophagitis: Secondary | ICD-10-CM | POA: Diagnosis not present

## 2019-09-30 DIAGNOSIS — R112 Nausea with vomiting, unspecified: Secondary | ICD-10-CM | POA: Diagnosis not present

## 2019-09-30 DIAGNOSIS — F418 Other specified anxiety disorders: Secondary | ICD-10-CM | POA: Diagnosis not present

## 2019-09-30 DIAGNOSIS — S7012XA Contusion of left thigh, initial encounter: Secondary | ICD-10-CM | POA: Diagnosis not present

## 2019-09-30 DIAGNOSIS — K838 Other specified diseases of biliary tract: Secondary | ICD-10-CM | POA: Diagnosis not present

## 2019-09-30 DIAGNOSIS — G8911 Acute pain due to trauma: Secondary | ICD-10-CM | POA: Diagnosis not present

## 2019-09-30 DIAGNOSIS — N179 Acute kidney failure, unspecified: Secondary | ICD-10-CM | POA: Diagnosis not present

## 2019-09-30 DIAGNOSIS — W1839XA Other fall on same level, initial encounter: Secondary | ICD-10-CM | POA: Diagnosis not present

## 2019-09-30 DIAGNOSIS — Y92129 Unspecified place in nursing home as the place of occurrence of the external cause: Secondary | ICD-10-CM | POA: Diagnosis not present

## 2019-10-01 ENCOUNTER — Other Ambulatory Visit: Payer: Self-pay

## 2019-10-01 DIAGNOSIS — Z96642 Presence of left artificial hip joint: Secondary | ICD-10-CM | POA: Diagnosis not present

## 2019-10-01 DIAGNOSIS — K838 Other specified diseases of biliary tract: Secondary | ICD-10-CM | POA: Diagnosis not present

## 2019-10-01 DIAGNOSIS — W19XXXS Unspecified fall, sequela: Secondary | ICD-10-CM | POA: Diagnosis not present

## 2019-10-01 DIAGNOSIS — Z09 Encounter for follow-up examination after completed treatment for conditions other than malignant neoplasm: Secondary | ICD-10-CM | POA: Diagnosis not present

## 2019-10-01 DIAGNOSIS — K869 Disease of pancreas, unspecified: Secondary | ICD-10-CM | POA: Diagnosis not present

## 2019-10-01 DIAGNOSIS — K8689 Other specified diseases of pancreas: Secondary | ICD-10-CM | POA: Diagnosis not present

## 2019-10-01 DIAGNOSIS — S72002S Fracture of unspecified part of neck of left femur, sequela: Secondary | ICD-10-CM | POA: Diagnosis not present

## 2019-10-01 DIAGNOSIS — R5381 Other malaise: Secondary | ICD-10-CM | POA: Diagnosis not present

## 2019-10-01 DIAGNOSIS — K219 Gastro-esophageal reflux disease without esophagitis: Secondary | ICD-10-CM | POA: Diagnosis not present

## 2019-10-01 DIAGNOSIS — R319 Hematuria, unspecified: Secondary | ICD-10-CM | POA: Diagnosis not present

## 2019-10-01 DIAGNOSIS — R410 Disorientation, unspecified: Secondary | ICD-10-CM | POA: Diagnosis not present

## 2019-10-01 DIAGNOSIS — W1839XA Other fall on same level, initial encounter: Secondary | ICD-10-CM | POA: Diagnosis not present

## 2019-10-01 DIAGNOSIS — G8911 Acute pain due to trauma: Secondary | ICD-10-CM | POA: Diagnosis not present

## 2019-10-01 DIAGNOSIS — I10 Essential (primary) hypertension: Secondary | ICD-10-CM | POA: Diagnosis not present

## 2019-10-01 DIAGNOSIS — Z471 Aftercare following joint replacement surgery: Secondary | ICD-10-CM | POA: Diagnosis not present

## 2019-10-01 DIAGNOSIS — R112 Nausea with vomiting, unspecified: Secondary | ICD-10-CM | POA: Diagnosis not present

## 2019-10-01 DIAGNOSIS — C259 Malignant neoplasm of pancreas, unspecified: Secondary | ICD-10-CM | POA: Diagnosis not present

## 2019-10-01 DIAGNOSIS — Z9181 History of falling: Secondary | ICD-10-CM | POA: Diagnosis not present

## 2019-10-01 DIAGNOSIS — Z5181 Encounter for therapeutic drug level monitoring: Secondary | ICD-10-CM | POA: Diagnosis not present

## 2019-10-01 DIAGNOSIS — I2510 Atherosclerotic heart disease of native coronary artery without angina pectoris: Secondary | ICD-10-CM | POA: Diagnosis not present

## 2019-10-01 DIAGNOSIS — N390 Urinary tract infection, site not specified: Secondary | ICD-10-CM | POA: Diagnosis not present

## 2019-10-01 DIAGNOSIS — D649 Anemia, unspecified: Secondary | ICD-10-CM | POA: Diagnosis not present

## 2019-10-01 DIAGNOSIS — N179 Acute kidney failure, unspecified: Secondary | ICD-10-CM | POA: Diagnosis not present

## 2019-10-01 DIAGNOSIS — S72002D Fracture of unspecified part of neck of left femur, subsequent encounter for closed fracture with routine healing: Secondary | ICD-10-CM | POA: Diagnosis not present

## 2019-10-01 DIAGNOSIS — F418 Other specified anxiety disorders: Secondary | ICD-10-CM | POA: Diagnosis not present

## 2019-10-01 DIAGNOSIS — M6281 Muscle weakness (generalized): Secondary | ICD-10-CM | POA: Diagnosis not present

## 2019-10-01 DIAGNOSIS — R1110 Vomiting, unspecified: Secondary | ICD-10-CM | POA: Diagnosis not present

## 2019-10-01 NOTE — Patient Outreach (Signed)
[facility]) Care Management  10/01/2019  Alexis Jackson 1927/08/29 [id number]     Transition of Care Referral  Referral Date: 10/01/2019 Referral Source: [employer] Discharge Report Date of Admission: 09/17/2019 Diagnosis: femur fracture Date of Discharge: 09/23/2019 Facility: [place]: [facility]    Outreach attempt # 1 to patient. A female answered the phone and identified himself as patient's son/HCPOA. He freely reported that patient was still in the hospital and hopefully being transferred to [facility] later today.     Plan: RN CM will close case as patient inpatient and await for patient to appear on discharge report when she leaves facility.    Enzo Montgomery, RN,BSN,CCM [facility] Telephonic Care Management Coordinator Direct Phone: [phone] Toll Free: [phone] Fax: [phone]

## 2019-10-02 DIAGNOSIS — R5381 Other malaise: Secondary | ICD-10-CM | POA: Diagnosis not present

## 2019-10-05 DIAGNOSIS — R5381 Other malaise: Secondary | ICD-10-CM | POA: Diagnosis not present

## 2019-10-06 DIAGNOSIS — Z09 Encounter for follow-up examination after completed treatment for conditions other than malignant neoplasm: Secondary | ICD-10-CM | POA: Diagnosis not present

## 2019-10-07 DIAGNOSIS — R5381 Other malaise: Secondary | ICD-10-CM | POA: Diagnosis not present

## 2019-10-09 DIAGNOSIS — R5381 Other malaise: Secondary | ICD-10-CM | POA: Diagnosis not present

## 2019-10-12 DIAGNOSIS — K838 Other specified diseases of biliary tract: Secondary | ICD-10-CM | POA: Diagnosis not present

## 2019-10-12 DIAGNOSIS — K8689 Other specified diseases of pancreas: Secondary | ICD-10-CM | POA: Diagnosis not present

## 2019-10-13 DIAGNOSIS — D649 Anemia, unspecified: Secondary | ICD-10-CM | POA: Diagnosis not present

## 2019-10-13 DIAGNOSIS — R5381 Other malaise: Secondary | ICD-10-CM | POA: Diagnosis not present

## 2019-10-13 DIAGNOSIS — Z5181 Encounter for therapeutic drug level monitoring: Secondary | ICD-10-CM | POA: Diagnosis not present

## 2019-10-15 DIAGNOSIS — R5381 Other malaise: Secondary | ICD-10-CM | POA: Diagnosis not present

## 2019-10-19 DIAGNOSIS — R251 Tremor, unspecified: Secondary | ICD-10-CM | POA: Diagnosis not present

## 2019-10-23 DIAGNOSIS — E785 Hyperlipidemia, unspecified: Secondary | ICD-10-CM | POA: Diagnosis not present

## 2019-10-23 DIAGNOSIS — I10 Essential (primary) hypertension: Secondary | ICD-10-CM | POA: Diagnosis not present

## 2019-10-23 DIAGNOSIS — Z09 Encounter for follow-up examination after completed treatment for conditions other than malignant neoplasm: Secondary | ICD-10-CM | POA: Diagnosis not present

## 2019-10-23 DIAGNOSIS — R42 Dizziness and giddiness: Secondary | ICD-10-CM | POA: Diagnosis not present

## 2019-10-23 DIAGNOSIS — F411 Generalized anxiety disorder: Secondary | ICD-10-CM | POA: Diagnosis not present

## 2019-10-23 DIAGNOSIS — M25552 Pain in left hip: Secondary | ICD-10-CM | POA: Diagnosis not present

## 2019-10-23 DIAGNOSIS — G8929 Other chronic pain: Secondary | ICD-10-CM | POA: Diagnosis not present

## 2019-10-23 DIAGNOSIS — G609 Hereditary and idiopathic neuropathy, unspecified: Secondary | ICD-10-CM | POA: Diagnosis not present

## 2019-10-23 DIAGNOSIS — R110 Nausea: Secondary | ICD-10-CM | POA: Diagnosis not present

## 2019-11-19 DIAGNOSIS — K219 Gastro-esophageal reflux disease without esophagitis: Secondary | ICD-10-CM | POA: Diagnosis not present

## 2019-11-19 DIAGNOSIS — F411 Generalized anxiety disorder: Secondary | ICD-10-CM | POA: Diagnosis not present

## 2019-11-19 DIAGNOSIS — Z951 Presence of aortocoronary bypass graft: Secondary | ICD-10-CM | POA: Diagnosis not present

## 2019-11-19 DIAGNOSIS — I10 Essential (primary) hypertension: Secondary | ICD-10-CM | POA: Diagnosis not present

## 2019-11-19 DIAGNOSIS — R634 Abnormal weight loss: Secondary | ICD-10-CM | POA: Diagnosis not present

## 2019-11-19 DIAGNOSIS — C259 Malignant neoplasm of pancreas, unspecified: Secondary | ICD-10-CM | POA: Diagnosis not present

## 2019-11-19 DIAGNOSIS — K838 Other specified diseases of biliary tract: Secondary | ICD-10-CM | POA: Diagnosis not present

## 2019-11-19 DIAGNOSIS — Z96642 Presence of left artificial hip joint: Secondary | ICD-10-CM | POA: Diagnosis not present

## 2019-11-23 DIAGNOSIS — K219 Gastro-esophageal reflux disease without esophagitis: Secondary | ICD-10-CM | POA: Diagnosis not present

## 2019-11-23 DIAGNOSIS — K8689 Other specified diseases of pancreas: Secondary | ICD-10-CM | POA: Diagnosis not present

## 2019-11-23 DIAGNOSIS — F0390 Unspecified dementia without behavioral disturbance: Secondary | ICD-10-CM | POA: Diagnosis not present

## 2019-11-23 DIAGNOSIS — G609 Hereditary and idiopathic neuropathy, unspecified: Secondary | ICD-10-CM | POA: Diagnosis not present

## 2019-11-23 DIAGNOSIS — G250 Essential tremor: Secondary | ICD-10-CM | POA: Diagnosis not present

## 2019-11-23 DIAGNOSIS — R269 Unspecified abnormalities of gait and mobility: Secondary | ICD-10-CM | POA: Diagnosis not present

## 2019-11-23 DIAGNOSIS — R351 Nocturia: Secondary | ICD-10-CM | POA: Diagnosis not present

## 2019-11-23 DIAGNOSIS — R110 Nausea: Secondary | ICD-10-CM | POA: Diagnosis not present

## 2019-11-23 DIAGNOSIS — F411 Generalized anxiety disorder: Secondary | ICD-10-CM | POA: Diagnosis not present

## 2019-12-01 DEATH — deceased

## 2019-12-02 ENCOUNTER — Other Ambulatory Visit: Payer: Self-pay | Admitting: Internal Medicine

## 2019-12-02 DIAGNOSIS — K8689 Other specified diseases of pancreas: Secondary | ICD-10-CM

## 2019-12-16 ENCOUNTER — Ambulatory Visit
Admission: RE | Admit: 2019-12-16 | Discharge: 2019-12-16 | Disposition: A | Discharge status: Home / Self Care | Payer: Medicare HMO | Source: Ambulatory Visit | Attending: Internal Medicine | Admitting: Internal Medicine

## 2019-12-16 ENCOUNTER — Other Ambulatory Visit: Payer: Self-pay

## 2019-12-16 DIAGNOSIS — K8689 Other specified diseases of pancreas: Secondary | ICD-10-CM | POA: Diagnosis not present

## 2019-12-16 LAB — POCT I-STAT CREATININE: Creatinine, Ser: 1.2 mg/dL — ABNORMAL HIGH (ref 0.44–1.00)

## 2019-12-16 MED ORDER — IOHEXOL 300 MG/ML  SOLN: 100.0000 mL | Freq: Once | INTRAMUSCULAR | Status: DC | PRN

## 2019-12-16 MED ORDER — IOHEXOL 300 MG/ML  SOLN
75.0000 mL | Freq: Once | INTRAMUSCULAR | Status: AC | PRN
  Administered 2019-12-16: 75 mL via INTRAVENOUS

## 2019-12-22 DIAGNOSIS — L570 Actinic keratosis: Secondary | ICD-10-CM | POA: Diagnosis not present

## 2019-12-22 DIAGNOSIS — L821 Other seborrheic keratosis: Secondary | ICD-10-CM | POA: Diagnosis not present

## 2019-12-22 DIAGNOSIS — L578 Other skin changes due to chronic exposure to nonionizing radiation: Secondary | ICD-10-CM | POA: Diagnosis not present

## 2020-01-19 DIAGNOSIS — E559 Vitamin D deficiency, unspecified: Secondary | ICD-10-CM | POA: Diagnosis not present

## 2020-01-19 DIAGNOSIS — R32 Unspecified urinary incontinence: Secondary | ICD-10-CM | POA: Diagnosis not present

## 2020-01-19 DIAGNOSIS — Z0000 Encounter for general adult medical examination without abnormal findings: Secondary | ICD-10-CM | POA: Diagnosis not present

## 2020-01-19 DIAGNOSIS — G609 Hereditary and idiopathic neuropathy, unspecified: Secondary | ICD-10-CM | POA: Diagnosis not present

## 2020-01-19 DIAGNOSIS — M25562 Pain in left knee: Secondary | ICD-10-CM | POA: Diagnosis not present

## 2020-01-19 DIAGNOSIS — R2681 Unsteadiness on feet: Secondary | ICD-10-CM | POA: Diagnosis not present

## 2020-01-19 DIAGNOSIS — I129 Hypertensive chronic kidney disease with stage 1 through stage 4 chronic kidney disease, or unspecified chronic kidney disease: Secondary | ICD-10-CM | POA: Diagnosis not present

## 2020-01-19 DIAGNOSIS — N183 Chronic kidney disease, stage 3 unspecified: Secondary | ICD-10-CM | POA: Diagnosis not present

## 2020-01-19 DIAGNOSIS — Z79899 Other long term (current) drug therapy: Secondary | ICD-10-CM | POA: Diagnosis not present

## 2020-01-26 ENCOUNTER — Other Ambulatory Visit: Payer: Self-pay

## 2020-01-26 ENCOUNTER — Encounter: Payer: Self-pay | Admitting: Physical Therapy

## 2020-01-26 ENCOUNTER — Ambulatory Visit: Payer: Medicare Other | Attending: Internal Medicine | Admitting: Physical Therapy

## 2020-01-26 DIAGNOSIS — M6281 Muscle weakness (generalized): Secondary | ICD-10-CM | POA: Insufficient documentation

## 2020-01-26 DIAGNOSIS — R296 Repeated falls: Secondary | ICD-10-CM | POA: Diagnosis not present

## 2020-01-26 DIAGNOSIS — R262 Difficulty in walking, not elsewhere classified: Secondary | ICD-10-CM | POA: Diagnosis not present

## 2020-01-26 NOTE — Therapy (Signed)
[facility] [address] [place], [place], [address] Phone: [phone]   Fax:  [phone]  Physical Therapy Evaluation  Patient Details  Name: Alexis Jackson MRN: [id number] Date of Birth: 1927-10-01 Referring Provider (PT): [place], [place]   Encounter Date: 01/26/2020  PT End of Session - 01/26/20 1006    Visit Number  1    Number of Visits  13    Date for PT Re-Evaluation  03/08/20    Authorization Type  IE: 01/26/2020    PT Start Time  1010    PT Stop Time  1100    PT Time Calculation (min)  50 min    Equipment Utilized During Treatment  Gait belt    Activity Tolerance  Patient tolerated treatment well    Behavior During Therapy  [facility] for tasks assessed/performed       Past Medical History:  Diagnosis Date  . Benign essential tremor   . CAD (coronary artery disease)   . Dementia ([place])   . GAD (generalized anxiety disorder)   . GERD (gastroesophageal reflux disease)   . HLD (hyperlipidemia)   . Hypertension   . PAD (peripheral artery disease) ([place])   . Pulmonary fibrosis ([place])     Past Surgical History:  Procedure Laterality Date  . ABDOMINAL HYSTERECTOMY    . CHOLECYSTECTOMY    . CORONARY ARTERY BYPASS GRAFT    . HIP ARTHROPLASTY Left 09/18/2019   Procedure: ARTHROPLASTY BIPOLAR HIP (HEMIARTHROPLASTY) LEFT;  Surgeon: Leim Fabry, MD;  Location: [facility] ORS;  Service: Orthopedics;  Laterality: Left;  . KNEE ARTHROPLASTY      There were no vitals filed for this visit.  SUBJECTIVE Chief complaint: Patient and son Alexis Jackson) report patient has had several falls in the past 6 months; one which fractured her L hip (September); one resulting in a laceration to the back of the head, and a few more at bedside and in the bathroom without any major injury. Patient's son notes that prior to falling anf fracturing her hip she was able to walk with a cane for short community distances. Patient lost her husband in July and her son moved her  up to [facility] shortly after. Patient notes that she has pain in L knee (hx of TKA). Patient notes that she has pain from her L groin down to her knee 8/10.   Onset: July 2019  Imaging: None Recent changes in overall health/medication: No Directional pattern for falls: None Prior history of physical therapy for balance: Yes; did not like. Patient notes that she feels the therapy did not address the issue (PN). Follow-up appointment with MD: March/April Red flags (bowel/bladder changes, saddle paresthesia, personal history of cancer, chills/fever, night sweats, unrelenting pain) Negative  Patient stated goals: "be able to do more things independently" such as walking; son getting up and down with more ease  OBJECTIVE MUSCULOSKELETAL: Tremor: Absent Bulk: Normal Tone: Normal, no clonus  Posture Increased kyphosis throughout spine, decreased glute tone with increased posterior tilt in both positions  Gait Rollator, limited foot clearance and knee flexion B, L>R. Decreased stride length.  Strength R/L 4/3+ Hip flexion 4/3 Hip abduction (seated) 4/3+ Hip adduction (seated) 5/3+ Knee extension 4/3+ Knee flexion 5/3+ Ankle Plantarflexion (seated) 3+/3 Ankle Dorsiflexion   NEUROLOGICAL: Mental Status Patient is oriented to person, place and time.  Recent memory is intact.  Remote memory is intact. Attention span and concentration are intact.  Expressive speech is intact.  Patient's fund of knowledge is  within normal limits for educational level. Per patient's son: patient is having moments of "fogginess" with increased confounding of recent and remote events.   Cranial Nerves Visual acuity and visual fields are intact  Extraocular muscles are intact  Facial sensation is intact bilaterally  Facial strength is intact bilaterally  Hearing is normal as tested by gross conversation Palate elevates midline, normal phonation  Shoulder shrug strength is intact  Tongue protrudes  midline   Sensation Grossly intact to light touch bilateral UEs/LEs as determined by testing dermatomes C2-T2/L2-S2 respectively Proprioception and hot/cold testing deferred on this date  Reflexes R/L 2+/2+ Knee Jerk (L3/4) 2+/2+ Ankle Jerk (S1/2)  Coordination/Cerebellar Finger to Nose: WNL Rapid alternating movements: requires cueing Finger Opposition: requires cueing   FUNCTIONAL OUTCOME MEASURES   Results Comments  BERG Deferred 2/2 to time /56 Fall risk, in need of intervention  TUG 39.3 seconds  Rollator, VCs for sequencing  5TSTS 39.4 seconds BUE use, max VCs for sequencing of hand placement for safety    ASSESSMENT Patient is a 86 year old presenting to clinic with chief complaints of increased BLE weakness and repeated falls. Upon examination, patient demonstrates deficits in BLE strength, LLE pain, balance, gait, and function as evidenced by MMT LLE 3+/5 RLE 4/5, LLE 8/10 pain with increased activity, 5TSTS 39.4 sec with BUE use and max VCs for safe sequencing, TUG 39.3 seconds. Patient's responses on FOTO outcome measures (20) indicate severe functional limitations/disability/distress. Patient's progress may be limited due to cognitive changes and presence of incontinence; however, patient's support is advantageous. Patient was able to achieve basic seated BLE strengthening during today's evaluation and responded positively to active interventions. Patient will benefit from continued skilled therapeutic intervention to address deficits in BLE strength, LLE pain, balance, gait, and function in order to decrease risk of falls, increase function, and improve overall QOL.   [facility] PT Assessment - 01/26/20 0001      Assessment   Medical Diagnosis  Gait instability    Referring Provider (PT)  Thies, D    Hand Dominance  Right    Prior Therapy  Yes; didn't like it      Precautions   Precautions  Fall      Balance Screen   Has the patient fallen in the past 6 months  Yes     How many times?  2+    Has the patient had a decrease in activity level because of a fear of falling?   Yes    Is the patient reluctant to leave their home because of a fear of falling?   Yes      [place]  Private residence    Living Arrangements  Children    Type of [place] to enter    Entrance Stairs-Number of Steps  6    Entrance Stairs-Rails  None    Home Layout  One level      Prior Function   Level of Independence  Independent with household mobility with device;Needs assistance with ADLs;Requires assistive device for independence         Objective measurements completed on examination: See above findings.    TREATMENT  Therapeutic Exercise: Seated hip abduction, RTB, BLE, x20 Seated LAQ, RTB, BLE, x10 Seated heel/toe raises, x20  Patient educated on prognosis, POC, and provided with HEP including: aforementioned exercises. Patient articulated understanding and returned demonstration. Patient will benefit from further education in order  to maximize compliance and understanding for long-term therapeutic gains.        PT Long Term Goals - 01/26/20 1525      PT LONG TERM GOAL #1   Title  Patient will decrease TUG to below 14 seconds/decrease by 4 seconds using LRAD in order to demonstrate decreased fall risk.    Baseline  IE: 39.3 seconds, rollator    Time  6    Period  Weeks    Status  New    Target Date  03/08/20      PT LONG TERM GOAL #2   Title  Patient will decrease 5TSTS by at least 3 seconds without UE support nor VCs for safe hand placement in order to demonstrate clinically significant improvement in LE strength.    Baseline  IE: 39.4 sec, BUE support, max VCs    Time  6    Period  Weeks    Status  New    Target Date  03/08/20      PT LONG TERM GOAL #3   Title  Patient will demonstrate improved function as evidenced by a score of 43 on FOTO measure for full participation in activities at home  and in the community.    Baseline  IE: 20    Time  6    Period  Weeks    Status  New    Target Date  03/08/20      PT LONG TERM GOAL #4   Title  Patient will improve BERG by at least 3 points in order to demonstrate clinically significant improvement in balance.    Baseline  IE: deferred 2/2 to time    Time  6    Period  Weeks    Status  New    Target Date  03/08/20             Plan - 01/26/20 1007    Clinical Impression Statement  Patient is a 86 year old presenting to clinic with chief complaints of increased BLE weakness and repeated falls. Upon examination, patient demonstrates deficits in BLE strength, LLE pain, balance, gait, and function as evidenced by MMT LLE 3+/5 RLE 4/5, LLE 8/10 pain with increased activity, 5TSTS 39.4 sec with BUE use and max VCs for safe sequencing, TUG 39.3 seconds. Patient's responses on FOTO outcome measures (20) indicate severe functional limitations/disability/distress. Patient's progress may be limited due to cognitive changes and presence of incontinence; however, patient's support is advantageous. Patient was able to achieve basic seated BLE strengthening during today's evaluation and responded positively to active interventions. Patient will benefit from continued skilled therapeutic intervention to address deficits in BLE strength, LLE pain, balance, gait, and function in order to decrease risk of falls, increase function, and improve overall QOL.    Personal Factors and Comorbidities  Age;Education;Sex;Social Background;Time since onset of injury/illness/exacerbation;Comorbidity 3+;Behavior Pattern;Past/Current Experience    Comorbidities  Essential hypertension, CRI (chronic renal insufficiency), stage 3 (moderate),Urinary incontinence, Generalized anxiety disorder, Chronic nausea, Idiopathic peripheral neuropathy, Dementia    Examination-Activity Limitations  Dressing;Transfers;Bend;Lift;Squat;Stairs;Locomotion  Level;Carry;Continence;Toileting;Reach Overhead;Stand    Examination-Participation Restrictions  Yard Work;Personal Finances;Cleaning;Community Activity;Shop;Meal Prep;Driving    Stability/Clinical Decision Making  Evolving/Moderate complexity    Clinical Decision Making  Moderate    Rehab Potential  Fair    PT Frequency  2x / week    PT Duration  6 weeks    PT Treatment/Interventions  ADLs/Self Care Home Management;Canalith Repostioning;Cryotherapy;Moist Heat;DME Instruction;Gait training;Stair training;Functional mobility training;Therapeutic activities;Neuromuscular re-education;Balance training;Therapeutic exercise;Patient/family education;Orthotic  Fit/Training;Manual techniques;Passive range of motion;Taping;Vestibular;Energy conservation    PT Next Visit Plan  [place]erg, BLE strengthening    PT Home Exercise Plan  seated: hip abduction, heel/toe raises, LAQ    Consulted and Agree with Plan of Care  Patient;Family member/caregiver    Family Member Consulted  Alexis Jackson (son)       Patient will benefit from skilled therapeutic intervention in order to improve the following deficits and impairments:  Abnormal gait, Decreased balance, Decreased endurance, Decreased mobility, Difficulty walking, Postural dysfunction, Improper body mechanics, Decreased activity tolerance, Decreased coordination, Decreased safety awareness, Decreased strength, Impaired flexibility, Pain  Visit Diagnosis: Difficulty in walking, not elsewhere classified  Muscle weakness (generalized)  Repeated falls     Problem List Patient Active Problem List   Diagnosis Date Noted  . Hip fracture ([place]) 09/17/2019  . HTN (hypertension) 09/17/2019  . GAD (generalized anxiety disorder) 09/17/2019  . GERD (gastroesophageal reflux disease) 09/17/2019  . CAD (coronary artery disease) 09/17/2019   Myles Gip PT, DPT [phone] 01/26/2020, 3:29 PM  [facility] [address] [place], [place], [address] Phone: [phone]   Fax:  [phone]  Name: Sharlyne Terrill MRN: [id number] Date of Birth: September 15, 1927

## 2020-01-28 ENCOUNTER — Encounter: Payer: Self-pay | Admitting: Physical Therapy

## 2020-01-28 ENCOUNTER — Other Ambulatory Visit: Payer: Self-pay

## 2020-01-28 ENCOUNTER — Ambulatory Visit: Payer: Medicare Other | Admitting: Physical Therapy

## 2020-01-28 DIAGNOSIS — R262 Difficulty in walking, not elsewhere classified: Secondary | ICD-10-CM

## 2020-01-28 DIAGNOSIS — M6281 Muscle weakness (generalized): Secondary | ICD-10-CM | POA: Diagnosis not present

## 2020-01-28 DIAGNOSIS — R296 Repeated falls: Secondary | ICD-10-CM | POA: Diagnosis not present

## 2020-01-28 NOTE — Therapy (Signed)
[place] [facility] [address] [place], [place], [address] Phone: [phone]   Fax:  [phone]  Physical Therapy Treatment  Patient Details  Name: Alexis Jackson MRN: [id number] Date of Birth: 02-Apr-1927 Referring Provider (PT): [place], [place]   Encounter Date: 01/28/2020  PT End of Session - 01/28/20 1004    Visit Number  2    Number of Visits  13    Date for PT Re-Evaluation  03/08/20    Authorization Type  IE: 01/26/2020    PT Start Time  0957    PT Stop Time  1055    PT Time Calculation (min)  58 min    Equipment Utilized During Treatment  Gait belt    Activity Tolerance  Patient tolerated treatment well    Behavior During Therapy  [facility] for tasks assessed/performed       Past Medical History:  Diagnosis Date  . Benign essential tremor   . CAD (coronary artery disease)   . Dementia ([place])   . GAD (generalized anxiety disorder)   . GERD (gastroesophageal reflux disease)   . HLD (hyperlipidemia)   . Hypertension   . PAD (peripheral artery disease) ([place])   . Pulmonary fibrosis ([place])     Past Surgical History:  Procedure Laterality Date  . ABDOMINAL HYSTERECTOMY    . CHOLECYSTECTOMY    . CORONARY ARTERY BYPASS GRAFT    . HIP ARTHROPLASTY Left 09/18/2019   Procedure: ARTHROPLASTY BIPOLAR HIP (HEMIARTHROPLASTY) LEFT;  Surgeon: Leim Fabry, MD;  Location: [facility] ORS;  Service: Orthopedics;  Laterality: Left;  . KNEE ARTHROPLASTY      There were no vitals filed for this visit.  Subjective Assessment - 01/28/20 1003    Subjective  Patient notes that her pain has lessened since she started doing her exercises. She is eager for more exercises. Her son reports that she wanted more to do yesterday after finishing her exercises, but that she may have overdone it.    Patient is accompained by:  Family member   Son: [name]   Currently in Pain?  No/denies   Faces     TREATMENT  Neuromuscular Re-education: Merrilee Jansky components: 33/56  indicative of high falls risk and full-time walker use // bars: (SBA/CGA)  Tandem walking, fwd/bwd, SUE support x3 laps  EO/EC, NBOS, x30 sec each, faded UE support  6" stair taps, BUE support, alternating x10  6" stair taps, SUE support, BLE x10    Therapeutic Exercise: Nu-step, L3-1, 6 min, BUE/BLE, seat 8 for improved tolerance to activity Seated strengthening:  LAQ x15 BLE, 1.5# CW  Hip flexion march x15 BLE, 1.5# CW Standing strengthening:  Hip flexion march x15 BLE  Hip extension x15 BLE  Lateral walking (// bars) x4 laps   Patient educated throughout session on appropriate technique and form using multi-modal cueing, HEP, and activity modification. Patient articulated understanding and returned demonstration.   ASSESSMENT Patient presents to clinic with excellent motivation to participate in therapy. Patient demonstrates deficits in BLE strength, LLE pain, balance, gait, and function. Patient able to achieve increased standing exercises with faded UE support and absent of LOB during today's session and responded positively to active interventions. Patient will benefit from continued skilled therapeutic intervention to address remaining deficits in BLE strength, LLE pain, balance, gait, and function in order to decrease risk of falls, increase function, and improve overall QOL.   PT Long Term Goals - 01/26/20 1525      PT LONG TERM GOAL #1  Title  Patient will decrease TUG to below 14 seconds/decrease by 4 seconds using LRAD in order to demonstrate decreased fall risk.    Baseline  IE: 39.3 seconds, rollator    Time  6    Period  Weeks    Status  New    Target Date  03/08/20      PT LONG TERM GOAL #2   Title  Patient will decrease 5TSTS by at least 3 seconds without UE support nor VCs for safe hand placement in order to demonstrate clinically significant improvement in LE strength.    Baseline  IE: 39.4 sec, BUE support, max VCs    Time  6    Period  Weeks    Status   New    Target Date  03/08/20      PT LONG TERM GOAL #3   Title  Patient will demonstrate improved function as evidenced by a score of 43 on FOTO measure for full participation in activities at home and in the community.    Baseline  IE: 20    Time  6    Period  Weeks    Status  New    Target Date  03/08/20      PT LONG TERM GOAL #4   Title  Patient will improve BERG by at least 3 points in order to demonstrate clinically significant improvement in balance.    Baseline  IE: deferred 2/2 to time    Time  6    Period  Weeks    Status  New    Target Date  03/08/20            Plan - 01/28/20 1005    Clinical Impression Statement  Patient presents to clinic with excellent motivation to participate in therapy. Patient demonstrates deficits in BLE strength, LLE pain, balance, gait, and function. Patient able to achieve increased standing exercises with faded UE support and absent of LOB during today's session and responded positively to active interventions. Patient will benefit from continued skilled therapeutic intervention to address remaining deficits in BLE strength, LLE pain, balance, gait, and function in order to decrease risk of falls, increase function, and improve overall QOL.    Personal Factors and Comorbidities  Age;Education;Sex;Social Background;Time since onset of injury/illness/exacerbation;Comorbidity 3+;Behavior Pattern;Past/Current Experience    Comorbidities  Essential hypertension, CRI (chronic renal insufficiency), stage 3 (moderate),Urinary incontinence, Generalized anxiety disorder, Chronic nausea, Idiopathic peripheral neuropathy, Dementia    Examination-Activity Limitations  Dressing;Transfers;Bend;Lift;Squat;Stairs;Locomotion Level;Carry;Continence;Toileting;Reach Overhead;Stand    Examination-Participation Restrictions  Yard Work;Personal Finances;Cleaning;Community Activity;Shop;Meal Prep;Driving    Stability/Clinical Decision Making  Evolving/Moderate  complexity    Rehab Potential  Fair    PT Frequency  2x / week    PT Duration  6 weeks    PT Treatment/Interventions  ADLs/Self Care Home Management;Canalith Repostioning;Cryotherapy;Moist Heat;DME Instruction;Gait training;Stair training;Functional mobility training;Therapeutic activities;Neuromuscular re-education;Balance training;Therapeutic exercise;Patient/family education;Orthotic Fit/Training;Manual techniques;Passive range of motion;Taping;Vestibular;Energy conservation    PT Next Visit Plan  [place]erg, BLE strengthening    PT Home Exercise Plan  seated: hip abduction, heel/toe raises, LAQ    Consulted and Agree with Plan of Care  Patient;Family member/caregiver    Family Member Consulted  [name] (son)       Patient will benefit from skilled therapeutic intervention in order to improve the following deficits and impairments:  Abnormal gait, Decreased balance, Decreased endurance, Decreased mobility, Difficulty walking, Postural dysfunction, Improper body mechanics, Decreased activity tolerance, Decreased coordination, Decreased safety awareness, Decreased strength, Impaired flexibility, Pain  Visit Diagnosis: Difficulty in walking, not elsewhere classified  Muscle weakness (generalized)  Repeated falls     Problem List Patient Active Problem List   Diagnosis Date Noted  . Hip fracture ([place]) 09/17/2019  . HTN (hypertension) 09/17/2019  . GAD (generalized anxiety disorder) 09/17/2019  . GERD (gastroesophageal reflux disease) 09/17/2019  . CAD (coronary artery disease) 09/17/2019   Myles Gip PT, DPT [phone] 01/28/2020, 12:18 PM  [place] [facility] [address] [place], [place], [address] Phone: [phone]   Fax:  [phone]  Name: Alexis Jackson MRN: [id number] Date of Birth: 12-19-1927

## 2020-01-29 DIAGNOSIS — M25561 Pain in right knee: Secondary | ICD-10-CM | POA: Diagnosis not present

## 2020-01-29 DIAGNOSIS — M25562 Pain in left knee: Secondary | ICD-10-CM | POA: Diagnosis not present

## 2020-01-29 DIAGNOSIS — R251 Tremor, unspecified: Secondary | ICD-10-CM | POA: Diagnosis not present

## 2020-01-29 DIAGNOSIS — Z87820 Personal history of traumatic brain injury: Secondary | ICD-10-CM | POA: Diagnosis not present

## 2020-02-02 ENCOUNTER — Other Ambulatory Visit: Payer: Self-pay

## 2020-02-02 ENCOUNTER — Ambulatory Visit: Attending: Internal Medicine | Admitting: Physical Therapy

## 2020-02-02 ENCOUNTER — Encounter: Payer: Self-pay | Admitting: Physical Therapy

## 2020-02-02 DIAGNOSIS — R262 Difficulty in walking, not elsewhere classified: Secondary | ICD-10-CM | POA: Diagnosis not present

## 2020-02-02 DIAGNOSIS — M6281 Muscle weakness (generalized): Secondary | ICD-10-CM

## 2020-02-02 DIAGNOSIS — R296 Repeated falls: Secondary | ICD-10-CM | POA: Diagnosis present

## 2020-02-02 NOTE — Therapy (Signed)
[place] [facility] [address] [place], [place], [address] Phone: [phone]   Fax:  [phone]  Physical Therapy Treatment  Patient Details  Name: Alexis Jackson MRN: [id number] Date of Birth: 1927-03-17 Referring Provider (PT): [place], [place]   Encounter Date: 02/02/2020  PT End of Session - 02/02/20 1104    Visit Number  3    Number of Visits  13    Date for PT Re-Evaluation  03/08/20    Authorization Type  IE: 01/26/2020    PT Start Time  1100    PT Stop Time  1155    PT Time Calculation (min)  55 min    Equipment Utilized During Treatment  Gait belt    Activity Tolerance  Patient tolerated treatment well    Behavior During Therapy  [facility] for tasks assessed/performed       Past Medical History:  Diagnosis Date  . Benign essential tremor   . CAD (coronary artery disease)   . Dementia ([place])   . GAD (generalized anxiety disorder)   . GERD (gastroesophageal reflux disease)   . HLD (hyperlipidemia)   . Hypertension   . PAD (peripheral artery disease) ([place])   . Pulmonary fibrosis ([place])     Past Surgical History:  Procedure Laterality Date  . ABDOMINAL HYSTERECTOMY    . CHOLECYSTECTOMY    . CORONARY ARTERY BYPASS GRAFT    . HIP ARTHROPLASTY Left 09/18/2019   Procedure: ARTHROPLASTY BIPOLAR HIP (HEMIARTHROPLASTY) LEFT;  Surgeon: Leim Fabry, MD;  Location: [facility] ORS;  Service: Orthopedics;  Laterality: Left;  . KNEE ARTHROPLASTY      There were no vitals filed for this visit.  Subjective Assessment - 02/02/20 1103    Subjective  Patient reports that she had a boring weekend. She notes that her knees are feeling better. She did her HEP twice since her last visit and feels the exercises are still appropriately challenging.    Patient is accompained by:  Family member   Son: [name]   Currently in Pain?  No/denies       TREATMENT  Neuromuscular Re-education: // bars: (SBA/CGA)  Gait with cone taps, BLE, x4 laps, faded UE  support  Gait with cone step over, BLE, x3 laps, faded UE support  Tandem stance, EO, faded UE support, BLE, 30sec bouts  EO/EC, NBOS, x30 sec each, faded UE support  Airex normal BOS, EO (1 min) /EC (2x 5 sec), faded UE support,  Gait with hidden obstacles x3 laps, BUE faded to SUE support Patient education on balance systems (visual, proprioceptive, vestibular).   Therapeutic Exercise: Nu-step, L3, 7 min, BUE/BLE, seat 8 for improved tolerance to activity Standing strengthening (1.5# CW):  Hip flexion 6" stair taps alternating x20 BLE  Hip extension x20 BLE  Hamstring Curls x20 BLE   Patient educated throughout session on appropriate technique and form using multi-modal cueing, HEP, and activity modification. Patient articulated understanding and returned demonstration.   ASSESSMENT Patient presents to clinic with excellent motivation to participate in therapy. Patient demonstrates deficits in BLE strength, LLE pain, balance, gait, and function. Patient continues to be able to tolerate increased standing with faded UE support and absent of LOB during today's session and responded positively to active interventions. Patient will benefit from continued skilled therapeutic intervention to address remaining deficits in BLE strength, LLE pain, balance, gait, and function in order to decrease risk of falls, increase function, and improve overall QOL.   PT Long Term Goals - 01/26/20  [place] #1   Title  Patient will decrease TUG to below 14 seconds/decrease by 4 seconds using LRAD in order to demonstrate decreased fall risk.    Baseline  IE: 39.3 seconds, rollator    Time  6    Period  Weeks    Status  New    Target Date  03/08/20      PT LONG TERM GOAL #2   Title  Patient will decrease 5TSTS by at least 3 seconds without UE support nor VCs for safe hand placement in order to demonstrate clinically significant improvement in LE strength.    Baseline  IE: 39.4 sec, BUE  support, max VCs    Time  6    Period  Weeks    Status  New    Target Date  03/08/20      PT LONG TERM GOAL #3   Title  Patient will demonstrate improved function as evidenced by a score of 43 on FOTO measure for full participation in activities at home and in the community.    Baseline  IE: 20    Time  6    Period  Weeks    Status  New    Target Date  03/08/20      PT LONG TERM GOAL #4   Title  Patient will improve BERG by at least 3 points in order to demonstrate clinically significant improvement in balance.    Baseline  IE: deferred 2/2 to time    Time  6    Period  Weeks    Status  New    Target Date  03/08/20            Plan - 02/02/20 1105    Clinical Impression Statement  Patient presents to clinic with excellent motivation to participate in therapy. Patient demonstrates deficits in BLE strength, LLE pain, balance, gait, and function. Patient continues to be able to tolerate increased standing with faded UE support and absent of LOB during today's session and responded positively to active interventions. Patient will benefit from continued skilled therapeutic intervention to address remaining deficits in BLE strength, LLE pain, balance, gait, and function in order to decrease risk of falls, increase function, and improve overall QOL.    Personal Factors and Comorbidities  Age;Education;Sex;Social Background;Time since onset of injury/illness/exacerbation;Comorbidity 3+;Behavior Pattern;Past/Current Experience    Comorbidities  Essential hypertension, CRI (chronic renal insufficiency), stage 3 (moderate),Urinary incontinence, Generalized anxiety disorder, Chronic nausea, Idiopathic peripheral neuropathy, Dementia    Examination-Activity Limitations  Dressing;Transfers;Bend;Lift;Squat;Stairs;Locomotion Level;Carry;Continence;Toileting;Reach Overhead;Stand    Examination-Participation Restrictions  Yard Work;Personal Finances;Cleaning;Community Activity;Shop;Meal Prep;Driving     Stability/Clinical Decision Making  Evolving/Moderate complexity    Rehab Potential  Fair    PT Frequency  2x / week    PT Duration  6 weeks    PT Treatment/Interventions  ADLs/Self Care Home Management;Canalith Repostioning;Cryotherapy;Moist Heat;DME Instruction;Gait training;Stair training;Functional mobility training;Therapeutic activities;Neuromuscular re-education;Balance training;Therapeutic exercise;Patient/family education;Orthotic Fit/Training;Manual techniques;Passive range of motion;Taping;Vestibular;Energy conservation    PT Next Visit Plan  [place], BLE strengthening    PT Home Exercise Plan  seated: hip abduction, heel/toe raises, LAQ    Consulted and Agree with Plan of Care  Patient;Family member/caregiver    Family Member Consulted  [name] (son)       Patient will benefit from skilled therapeutic intervention in order to improve the following deficits and impairments:  Abnormal gait, Decreased balance, Decreased endurance, Decreased mobility, Difficulty walking, Postural dysfunction, Improper body  mechanics, Decreased activity tolerance, Decreased coordination, Decreased safety awareness, Decreased strength, Impaired flexibility, Pain  Visit Diagnosis: Difficulty in walking, not elsewhere classified  Muscle weakness (generalized)  Repeated falls     Problem List Patient Active Problem List   Diagnosis Date Noted  . Hip fracture ([place]) 09/17/2019  . HTN (hypertension) 09/17/2019  . GAD (generalized anxiety disorder) 09/17/2019  . GERD (gastroesophageal reflux disease) 09/17/2019  . CAD (coronary artery disease) 09/17/2019   Myles Gip PT, DPT [phone] 02/02/2020, 1:39 PM  [place] [facility] [address] [place], [place], [address] Phone: [phone]   Fax:  [phone]  Name: Alexis Jackson MRN: [id number] Date of Birth: 08-26-1927

## 2020-02-04 ENCOUNTER — Ambulatory Visit: Admitting: Physical Therapy

## 2020-02-04 DIAGNOSIS — Z96642 Presence of left artificial hip joint: Secondary | ICD-10-CM | POA: Diagnosis not present

## 2020-02-04 DIAGNOSIS — M25562 Pain in left knee: Secondary | ICD-10-CM | POA: Diagnosis not present

## 2020-02-04 DIAGNOSIS — S79912D Unspecified injury of left hip, subsequent encounter: Secondary | ICD-10-CM | POA: Diagnosis not present

## 2020-02-08 DIAGNOSIS — S76112D Strain of left quadriceps muscle, fascia and tendon, subsequent encounter: Secondary | ICD-10-CM | POA: Diagnosis not present

## 2020-02-09 ENCOUNTER — Encounter: Payer: Self-pay | Admitting: Physical Therapy

## 2020-02-09 ENCOUNTER — Ambulatory Visit: Admitting: Physical Therapy

## 2020-02-09 ENCOUNTER — Other Ambulatory Visit: Payer: Self-pay

## 2020-02-09 DIAGNOSIS — M6281 Muscle weakness (generalized): Secondary | ICD-10-CM

## 2020-02-09 DIAGNOSIS — R262 Difficulty in walking, not elsewhere classified: Secondary | ICD-10-CM

## 2020-02-09 DIAGNOSIS — R296 Repeated falls: Secondary | ICD-10-CM

## 2020-02-09 NOTE — Therapy (Signed)
[place] [facility] [address] [place], [place], [address] Phone: [phone]   Fax:  [phone]  Physical Therapy Treatment  Patient Details  Name: Alexis Jackson MRN: [id number] Date of Birth: 1927/04/30 Referring Provider (PT): [place], [place]   Encounter Date: 02/09/2020  PT End of Session - 02/09/20 1107    Visit Number  4    Number of Visits  13    Date for PT Re-Evaluation  03/08/20    Authorization Type  IE: 01/26/2020    PT Start Time  1100    PT Stop Time  1155    PT Time Calculation (min)  55 min    Equipment Utilized During Treatment  Gait belt    Activity Tolerance  Patient tolerated treatment well    Behavior During Therapy  [facility] for tasks assessed/performed       Past Medical History:  Diagnosis Date  . Benign essential tremor   . CAD (coronary artery disease)   . Dementia ([place])   . GAD (generalized anxiety disorder)   . GERD (gastroesophageal reflux disease)   . HLD (hyperlipidemia)   . Hypertension   . PAD (peripheral artery disease) ([place])   . Pulmonary fibrosis ([place])     Past Surgical History:  Procedure Laterality Date  . ABDOMINAL HYSTERECTOMY    . CHOLECYSTECTOMY    . CORONARY ARTERY BYPASS GRAFT    . HIP ARTHROPLASTY Left 09/18/2019   Procedure: ARTHROPLASTY BIPOLAR HIP (HEMIARTHROPLASTY) LEFT;  Surgeon: Leim Fabry, MD;  Location: [facility] ORS;  Service: Orthopedics;  Laterality: Left;  . KNEE ARTHROPLASTY      There were no vitals filed for this visit.  Subjective Assessment - 02/09/20 1105    Subjective  Patient notes that she has been practicing her exercises at home. She reports no knee pain today. Patient's son, [name] states that her knee pain is much improved and the orthopedist recommended continuing exercise and walking. Patient denies any other changes to overall health.    Patient is accompained by:  Family member   Son: [name]   Currently in Pain?  No/denies       TREATMENT  Neuromuscular  Re-education: // bars: (SBA/CGA)  Object pick up from 18" surface, RUE x6, LUE x6 with trunk rotation bilaterally  Object pick up from 18" surface, no UE support x6 Thoracic extension over ball for improved posture and posterior chain strength Scapular retraction, YTB, for improved posterior chain strength and posture Seated hip hinge for improved postural control and mechanics during STS   Therapeutic Exercise: Nu-step, L3, 8 min, BUE/BLE, seat 8 for improved tolerance to activity, SpO2: 95% at end Hip abduction, RTB, x30 with glute set Hip adduction squeeze, 3 sec x30 STS with BUE x10  Patient educated throughout session on appropriate technique and form using multi-modal cueing, HEP, and activity modification. Patient articulated understanding and returned demonstration.   ASSESSMENT Patient presents to clinic with excellent motivation to participate in therapy. Patient demonstrates deficits in BLE strength, LLE pain, balance, gait, and function. Patient able to bend and collect objects from 18" without LOB, but with great benefit of SUE support during today's session and responded positively to active interventions. Patient will benefit from continued skilled therapeutic intervention to address remaining deficits in BLE strength, LLE pain, balance, gait, and function in order to decrease risk of falls, increase function, and improve overall QOL.   PT Long Term Goals - 01/26/20 1525      PT LONG TERM  GOAL #1   Title  Patient will decrease TUG to below 14 seconds/decrease by 4 seconds using LRAD in order to demonstrate decreased fall risk.    Baseline  IE: 39.3 seconds, rollator    Time  6    Period  Weeks    Status  New    Target Date  03/08/20      PT LONG TERM GOAL #2   Title  Patient will decrease 5TSTS by at least 3 seconds without UE support nor VCs for safe hand placement in order to demonstrate clinically significant improvement in LE strength.    Baseline  IE: 39.4 sec,  BUE support, max VCs    Time  6    Period  Weeks    Status  New    Target Date  03/08/20      PT LONG TERM GOAL #3   Title  Patient will demonstrate improved function as evidenced by a score of 43 on FOTO measure for full participation in activities at home and in the community.    Baseline  IE: 20    Time  6    Period  Weeks    Status  New    Target Date  03/08/20      PT LONG TERM GOAL #4   Title  Patient will improve BERG by at least 3 points in order to demonstrate clinically significant improvement in balance.    Baseline  IE: deferred 2/2 to time    Time  6    Period  Weeks    Status  New    Target Date  03/08/20            Plan - 02/09/20 1107    Clinical Impression Statement  Patient presents to clinic with excellent motivation to participate in therapy. Patient demonstrates deficits in BLE strength, LLE pain, balance, gait, and function. Patient able to bend and collect objects from 18" without LOB, but with great benefit of SUE support during today's session and responded positively to active interventions. Patient will benefit from continued skilled therapeutic intervention to address remaining deficits in BLE strength, LLE pain, balance, gait, and function in order to decrease risk of falls, increase function, and improve overall QOL.    Personal Factors and Comorbidities  Age;Education;Sex;Social Background;Time since onset of injury/illness/exacerbation;Comorbidity 3+;Behavior Pattern;Past/Current Experience    Comorbidities  Essential hypertension, CRI (chronic renal insufficiency), stage 3 (moderate),Urinary incontinence, Generalized anxiety disorder, Chronic nausea, Idiopathic peripheral neuropathy, Dementia    Examination-Activity Limitations  Dressing;Transfers;Bend;Lift;Squat;Stairs;Locomotion Level;Carry;Continence;Toileting;Reach Overhead;Stand    Examination-Participation Restrictions  Yard Work;Personal Finances;Cleaning;Community Activity;Shop;Meal  Prep;Driving    Stability/Clinical Decision Making  Evolving/Moderate complexity    Rehab Potential  Fair    PT Frequency  2x / week    PT Duration  6 weeks    PT Treatment/Interventions  ADLs/Self Care Home Management;Canalith Repostioning;Cryotherapy;Moist Heat;DME Instruction;Gait training;Stair training;Functional mobility training;Therapeutic activities;Neuromuscular re-education;Balance training;Therapeutic exercise;Patient/family education;Orthotic Fit/Training;Manual techniques;Passive range of motion;Taping;Vestibular;Energy conservation    PT Next Visit Plan  [place], BLE strengthening    PT Home Exercise Plan  seated: hip abduction, heel/toe raises, LAQ    Consulted and Agree with Plan of Care  Patient;Family member/caregiver    Family Member Consulted  [name] (son)       Patient will benefit from skilled therapeutic intervention in order to improve the following deficits and impairments:  Abnormal gait, Decreased balance, Decreased endurance, Decreased mobility, Difficulty walking, Postural dysfunction, Improper body mechanics, Decreased activity tolerance, Decreased coordination, Decreased safety  awareness, Decreased strength, Impaired flexibility, Pain  Visit Diagnosis: Difficulty in walking, not elsewhere classified  Muscle weakness (generalized)  Repeated falls     Problem List Patient Active Problem List   Diagnosis Date Noted  . Hip fracture ([place]) 09/17/2019  . HTN (hypertension) 09/17/2019  . GAD (generalized anxiety disorder) 09/17/2019  . GERD (gastroesophageal reflux disease) 09/17/2019  . CAD (coronary artery disease) 09/17/2019   Myles Gip PT, DPT [phone] 02/09/2020, 2:11 PM  [facility] [address] [place], [place], [address] Phone: [phone]   Fax:  [phone]  Name: Alexis Jackson MRN: [id number] Date of Birth: May 12, 1927

## 2020-02-11 ENCOUNTER — Ambulatory Visit: Admitting: Physical Therapy

## 2020-02-11 ENCOUNTER — Encounter: Payer: Self-pay | Admitting: Physical Therapy

## 2020-02-11 ENCOUNTER — Other Ambulatory Visit: Payer: Self-pay

## 2020-02-11 DIAGNOSIS — R262 Difficulty in walking, not elsewhere classified: Secondary | ICD-10-CM | POA: Diagnosis not present

## 2020-02-11 DIAGNOSIS — R296 Repeated falls: Secondary | ICD-10-CM

## 2020-02-11 DIAGNOSIS — M6281 Muscle weakness (generalized): Secondary | ICD-10-CM

## 2020-02-11 NOTE — Therapy (Signed)
[place] [facility] [address] [place], [place], [address] Phone: [phone]   Fax:  [phone]  Physical Therapy Treatment  Patient Details  Name: Alexis Jackson MRN: [id number] Date of Birth: 1927/02/03 Referring Provider (PT): [place], [place]   Encounter Date: 02/11/2020  PT End of Session - 02/11/20 1221    Visit Number  5    Number of Visits  13    Date for PT Re-Evaluation  03/08/20    Authorization Type  IE: 01/26/2020    PT Start Time  1100    PT Stop Time  1200    PT Time Calculation (min)  60 min    Equipment Utilized During Treatment  Gait belt    Activity Tolerance  Patient tolerated treatment well    Behavior During Therapy  [facility] for tasks assessed/performed       Past Medical History:  Diagnosis Date  . Benign essential tremor   . CAD (coronary artery disease)   . Dementia ([place])   . GAD (generalized anxiety disorder)   . GERD (gastroesophageal reflux disease)   . HLD (hyperlipidemia)   . Hypertension   . PAD (peripheral artery disease) ([place])   . Pulmonary fibrosis ([place])     Past Surgical History:  Procedure Laterality Date  . ABDOMINAL HYSTERECTOMY    . CHOLECYSTECTOMY    . CORONARY ARTERY BYPASS GRAFT    . HIP ARTHROPLASTY Left 09/18/2019   Procedure: ARTHROPLASTY BIPOLAR HIP (HEMIARTHROPLASTY) LEFT;  Surgeon: Leim Fabry, MD;  Location: [facility] ORS;  Service: Orthopedics;  Laterality: Left;  . KNEE ARTHROPLASTY      There were no vitals filed for this visit.  Subjective Assessment - 02/11/20 1240    Subjective  Patient reports that she did her exercises quite a bit yesterday and as a result is a bit sore in her glutes/hips. She also notes that she got her second dose of the vaccine yesterday and so far has not had any adverse effects. Otherwise, no significant changes nor concerns.    Patient is accompained by:  Family member   Son: [name]   Currently in Pain?  No/denies       TREATMENT  Neuromuscular  Re-education: // bars: (SBA)  6" hurdles x2, 6 laps, with 1.5# CW  Hip width BOS gait with faded UE support 6 laps  2 finger-tip support gait x4 laps  SPC gait x6 laps, SBA/CGA, noted unsteadiness with turns and fatigue. Decreased momentum control Thoracic extension over 1/2 foam roll for improved posture and posterior chain strength Scapular rotation push-out, YTB, for improved posterior chain strength and posture    Therapeutic Exercise: Nu-step, L2, 10 min, BUE/BLE, seat 8 for improved tolerance to activity, SpO2: 98% at end Seated LAQ, 1.5# CW, BLE, x15 Standing hip flexion, 1.5# CW, BLE, x15 Standing hip extension, 1.5# CW, BLE, x15 Lateral walking, BUE support, 1.5# CW, BLE, x3 laps in // bars  Patient educated throughout session on appropriate technique and form using multi-modal cueing, HEP, and activity modification. Patient articulated understanding and returned demonstration.   ASSESSMENT Patient presents to clinic with excellent motivation to participate in therapy. Patient demonstrates deficits in BLE strength, LLE pain, balance, gait, and function. Patient able to ambulate with SPC in // bars with SBA/CGA during today's session and responded positively to active interventions. Patient will benefit from continued skilled therapeutic intervention to address remaining deficits in BLE strength, LLE pain, balance, gait, and function in order to decrease risk of falls,  increase function, and improve overall QOL.   PT Long Term Goals - 01/26/20 1525      PT LONG TERM GOAL #1   Title  Patient will decrease TUG to below 14 seconds/decrease by 4 seconds using LRAD in order to demonstrate decreased fall risk.    Baseline  IE: 39.3 seconds, rollator    Time  6    Period  Weeks    Status  New    Target Date  03/08/20      PT LONG TERM GOAL #2   Title  Patient will decrease 5TSTS by at least 3 seconds without UE support nor VCs for safe hand placement in order to demonstrate  clinically significant improvement in LE strength.    Baseline  IE: 39.4 sec, BUE support, max VCs    Time  6    Period  Weeks    Status  New    Target Date  03/08/20      PT LONG TERM GOAL #3   Title  Patient will demonstrate improved function as evidenced by a score of 43 on FOTO measure for full participation in activities at home and in the community.    Baseline  IE: 20    Time  6    Period  Weeks    Status  New    Target Date  03/08/20      PT LONG TERM GOAL #4   Title  Patient will improve BERG by at least 3 points in order to demonstrate clinically significant improvement in balance.    Baseline  IE: deferred 2/2 to time    Time  6    Period  Weeks    Status  New    Target Date  03/08/20            Plan - 02/11/20 1222    Clinical Impression Statement  Patient presents to clinic with excellent motivation to participate in therapy. Patient demonstrates deficits in BLE strength, LLE pain, balance, gait, and function. Patient able to ambulate with SPC in // bars with SBA/CGA during today's session and responded positively to active interventions. Patient will benefit from continued skilled therapeutic intervention to address remaining deficits in BLE strength, LLE pain, balance, gait, and function in order to decrease risk of falls, increase function, and improve overall QOL.    Personal Factors and Comorbidities  Age;Education;Sex;Social Background;Time since onset of injury/illness/exacerbation;Comorbidity 3+;Behavior Pattern;Past/Current Experience    Comorbidities  Essential hypertension, CRI (chronic renal insufficiency), stage 3 (moderate),Urinary incontinence, Generalized anxiety disorder, Chronic nausea, Idiopathic peripheral neuropathy, Dementia    Examination-Activity Limitations  Dressing;Transfers;Bend;Lift;Squat;Stairs;Locomotion Level;Carry;Continence;Toileting;Reach Overhead;Stand    Examination-Participation Restrictions  Yard Work;Personal  Finances;Cleaning;Community Activity;Shop;Meal Prep;Driving    Stability/Clinical Decision Making  Evolving/Moderate complexity    Rehab Potential  Fair    PT Frequency  2x / week    PT Duration  6 weeks    PT Treatment/Interventions  ADLs/Self Care Home Management;Canalith Repostioning;Cryotherapy;Moist Heat;DME Instruction;Gait training;Stair training;Functional mobility training;Therapeutic activities;Neuromuscular re-education;Balance training;Therapeutic exercise;Patient/family education;Orthotic Fit/Training;Manual techniques;Passive range of motion;Taping;Vestibular;Energy conservation    PT Next Visit Plan  [place], BLE strengthening    PT Home Exercise Plan  seated: hip abduction, heel/toe raises, LAQ    Consulted and Agree with Plan of Care  Patient;Family member/caregiver    Family Member Consulted  [name] (son)       Patient will benefit from skilled therapeutic intervention in order to improve the following deficits and impairments:  Abnormal gait, Decreased balance, Decreased endurance,  Decreased mobility, Difficulty walking, Postural dysfunction, Improper body mechanics, Decreased activity tolerance, Decreased coordination, Decreased safety awareness, Decreased strength, Impaired flexibility, Pain  Visit Diagnosis: Difficulty in walking, not elsewhere classified  Muscle weakness (generalized)  Repeated falls     Problem List Patient Active Problem List   Diagnosis Date Noted  . Hip fracture ([place]) 09/17/2019  . HTN (hypertension) 09/17/2019  . GAD (generalized anxiety disorder) 09/17/2019  . GERD (gastroesophageal reflux disease) 09/17/2019  . CAD (coronary artery disease) 09/17/2019   Myles Gip PT, DPT [phone] 02/11/2020, 12:47 PM  [place] [facility] [address] [place], [place], [address] Phone: [phone]   Fax:  [phone]  Name: Mindie Herlong MRN: [id number] Date of Birth: 01-28-1927

## 2020-02-16 ENCOUNTER — Other Ambulatory Visit: Payer: Self-pay

## 2020-02-16 ENCOUNTER — Ambulatory Visit: Admitting: Physical Therapy

## 2020-02-16 ENCOUNTER — Encounter: Payer: Self-pay | Admitting: Physical Therapy

## 2020-02-16 DIAGNOSIS — R262 Difficulty in walking, not elsewhere classified: Secondary | ICD-10-CM

## 2020-02-16 DIAGNOSIS — E559 Vitamin D deficiency, unspecified: Secondary | ICD-10-CM | POA: Insufficient documentation

## 2020-02-16 DIAGNOSIS — M5136 Other intervertebral disc degeneration, lumbar region: Secondary | ICD-10-CM | POA: Insufficient documentation

## 2020-02-16 DIAGNOSIS — R296 Repeated falls: Secondary | ICD-10-CM

## 2020-02-16 DIAGNOSIS — E785 Hyperlipidemia, unspecified: Secondary | ICD-10-CM | POA: Insufficient documentation

## 2020-02-16 DIAGNOSIS — F0390 Unspecified dementia without behavioral disturbance: Secondary | ICD-10-CM | POA: Insufficient documentation

## 2020-02-16 DIAGNOSIS — M8580 Other specified disorders of bone density and structure, unspecified site: Secondary | ICD-10-CM | POA: Insufficient documentation

## 2020-02-16 DIAGNOSIS — G250 Essential tremor: Secondary | ICD-10-CM | POA: Insufficient documentation

## 2020-02-16 DIAGNOSIS — R110 Nausea: Secondary | ICD-10-CM | POA: Insufficient documentation

## 2020-02-16 DIAGNOSIS — K589 Irritable bowel syndrome without diarrhea: Secondary | ICD-10-CM | POA: Insufficient documentation

## 2020-02-16 DIAGNOSIS — R32 Unspecified urinary incontinence: Secondary | ICD-10-CM

## 2020-02-16 DIAGNOSIS — G629 Polyneuropathy, unspecified: Secondary | ICD-10-CM | POA: Insufficient documentation

## 2020-02-16 DIAGNOSIS — R7302 Impaired glucose tolerance (oral): Secondary | ICD-10-CM | POA: Insufficient documentation

## 2020-02-16 DIAGNOSIS — N183 Chronic kidney disease, stage 3 unspecified: Secondary | ICD-10-CM | POA: Insufficient documentation

## 2020-02-16 DIAGNOSIS — M6281 Muscle weakness (generalized): Secondary | ICD-10-CM

## 2020-02-16 NOTE — Therapy (Signed)
[facility] [address] [place], [place], [address] Phone: [phone]   Fax:  [phone]  Physical Therapy Treatment  Patient Details  Name: Alexis Jackson MRN: [id number] Date of Birth: [birth year] Referring Provider (PT): [place], [place]   Encounter Date: 02/16/2020  PT End of Session - 02/16/20 1115    Visit Number  6    Number of Visits  13    Date for PT Re-Evaluation  03/08/20    Authorization Type  IE: 01/26/2020    PT Start Time  1105    PT Stop Time  1158    PT Time Calculation (min)  53 min    Equipment Utilized During Treatment  Gait belt    Activity Tolerance  Patient tolerated treatment well    Behavior During Therapy  [facility] for tasks assessed/performed       Past Medical History:  Diagnosis Date  . Benign essential tremor   . CAD (coronary artery disease)   . Dementia ([place])   . GAD (generalized anxiety disorder)   . GERD (gastroesophageal reflux disease)   . HLD (hyperlipidemia)   . Hypertension   . PAD (peripheral artery disease) ([place])   . Pulmonary fibrosis ([place])     Past Surgical History:  Procedure Laterality Date  . ABDOMINAL HYSTERECTOMY    . CHOLECYSTECTOMY    . CORONARY ARTERY BYPASS GRAFT    . HIP ARTHROPLASTY Left 09/18/2019   Procedure: ARTHROPLASTY BIPOLAR HIP (HEMIARTHROPLASTY) LEFT;  Surgeon: Leim Fabry, MD;  Location: [facility] ORS;  Service: Orthopedics;  Laterality: Left;  . KNEE ARTHROPLASTY      There were no vitals filed for this visit.  Subjective Assessment - 02/16/20 1109    Subjective  Patient notes no significant changes since her last visit. Denies any falls. Patient also notes knee pain has not been a large issue.    Patient is accompained by:  Family member   Son: Alexis Jackson   Currently in Pain?  No/denies       TREATMENT  Neuromuscular Re-education: // bars: (SBA)  Static standing balance with faded UE support and UE reaching (fishing activity)  Picking up objects from a lower  surface (6" step), no UE support  Picking up objects from floor, SUE support Thoracic extension over ball for improved posture and posterior chain strength Postural correction with dowel feedback for improved posture and posterior chain strength   Therapeutic Exercise: Nu-step, L3, 7 min, BUE/BLE, seat 8 for improved tolerance to activity Hip abduction, RTB, x20 STS with BUE support and hip abduction isometric x10 with VCs for hip hinge   Patient educated throughout session on appropriate technique and form using multi-modal cueing, HEP, and activity modification. Patient articulated understanding and returned demonstration.   ASSESSMENT Patient presents to clinic with excellent motivation to participate in therapy. Patient demonstrates deficits in BLE strength, LLE pain, balance, gait, and function. Patient able to achieve decreased kyphotic posture with dowel feedback during today's session and responded positively to active interventions despite increased fatigue today. Patient will benefit from continued skilled therapeutic intervention to address remaining deficits in BLE strength, LLE pain, balance, gait, and function in order to decrease risk of falls, increase function, and improve overall QOL.   PT Long Term Goals - 01/26/20 1525      PT LONG TERM GOAL #1   Title  Patient will decrease TUG to below 14 seconds/decrease by 4 seconds using LRAD in order to demonstrate decreased fall risk.  Baseline  IE: 39.3 seconds, rollator    Time  6    Period  Weeks    Status  New    Target Date  03/08/20      PT LONG TERM GOAL #2   Title  Patient will decrease 5TSTS by at least 3 seconds without UE support nor VCs for safe hand placement in order to demonstrate clinically significant improvement in LE strength.    Baseline  IE: 39.4 sec, BUE support, max VCs    Time  6    Period  Weeks    Status  New    Target Date  03/08/20      PT LONG TERM GOAL #3   Title  Patient will demonstrate  improved function as evidenced by a score of 43 on FOTO measure for full participation in activities at home and in the community.    Baseline  IE: 20    Time  6    Period  Weeks    Status  New    Target Date  03/08/20      PT LONG TERM GOAL #4   Title  Patient will improve BERG by at least 3 points in order to demonstrate clinically significant improvement in balance.    Baseline  IE: deferred 2/2 to time    Time  6    Period  Weeks    Status  New    Target Date  03/08/20            Plan - 02/16/20 1149    Clinical Impression Statement  Patient presents to clinic with excellent motivation to participate in therapy. Patient demonstrates deficits in BLE strength, LLE pain, balance, gait, and function. Patient able to achieve decreased kyphotic posture with dowel feedback during today's session and responded positively to active interventions despite increased fatigue today. Patient will benefit from continued skilled therapeutic intervention to address remaining deficits in BLE strength, LLE pain, balance, gait, and function in order to decrease risk of falls, increase function, and improve overall QOL.    Personal Factors and Comorbidities  Age;Education;Sex;Social Background;Time since onset of injury/illness/exacerbation;Comorbidity 3+;Behavior Pattern;Past/Current Experience    Comorbidities  Essential hypertension, CRI (chronic renal insufficiency), stage 3 (moderate),Urinary incontinence, Generalized anxiety disorder, Chronic nausea, Idiopathic peripheral neuropathy, Dementia    Examination-Activity Limitations  Dressing;Transfers;Bend;Lift;Squat;Stairs;Locomotion Level;Carry;Continence;Toileting;Reach Overhead;Stand    Examination-Participation Restrictions  Yard Work;Personal Finances;Cleaning;Community Activity;Shop;Meal Prep;Driving    Stability/Clinical Decision Making  Evolving/Moderate complexity    Rehab Potential  Fair    PT Frequency  2x / week    PT Duration  6 weeks     PT Treatment/Interventions  ADLs/Self Care Home Management;Canalith Repostioning;Cryotherapy;Moist Heat;DME Instruction;Gait training;Stair training;Functional mobility training;Therapeutic activities;Neuromuscular re-education;Balance training;Therapeutic exercise;Patient/family education;Orthotic Fit/Training;Manual techniques;Passive range of motion;Taping;Vestibular;Energy conservation    PT Next Visit Plan  Berg, BLE strengthening    PT Home Exercise Plan  seated: hip abduction, heel/toe raises, LAQ    Consulted and Agree with Plan of Care  Patient;Family member/caregiver    Family Member Consulted  Alexis Jackson (son)       Patient will benefit from skilled therapeutic intervention in order to improve the following deficits and impairments:  Abnormal gait, Decreased balance, Decreased endurance, Decreased mobility, Difficulty walking, Postural dysfunction, Improper body mechanics, Decreased activity tolerance, Decreased coordination, Decreased safety awareness, Decreased strength, Impaired flexibility, Pain  Visit Diagnosis: Difficulty in walking, not elsewhere classified  Muscle weakness (generalized)  Repeated falls     Problem List Patient Active Problem List  Diagnosis Date Noted  . Benign essential tremor 02/16/2020  . Chronic nausea 02/16/2020  . CRI (chronic renal insufficiency), stage 3 (moderate) 02/16/2020  . Dementia ([place]) 02/16/2020  . Hyperlipidemia 02/16/2020  . IBS (irritable bowel syndrome) 02/16/2020  . Impaired glucose tolerance 02/16/2020  . Lumbar degenerative disc disease 02/16/2020  . Osteopenia 02/16/2020  . Peripheral neuropathy 02/16/2020  . Vitamin D deficiency 02/16/2020  . Hip fracture ([place]) 09/17/2019  . HTN (hypertension) 09/17/2019  . GAD (generalized anxiety disorder) 09/17/2019  . GERD (gastroesophageal reflux disease) 09/17/2019  . CAD (coronary artery disease) 09/17/2019  . High risk medication use 08/23/2019   Myles Gip PT, DPT  [phone] 02/16/2020, 4:38 PM  [facility] [address] [place], [place], [address] Phone: [phone]   Fax:  [phone]  Name: Alexis Jackson MRN: [id number] Date of Birth: 12/04/1927

## 2020-02-18 ENCOUNTER — Ambulatory Visit: Admitting: Physical Therapy

## 2020-02-19 ENCOUNTER — Ambulatory Visit: Payer: Medicare HMO | Admitting: Urology

## 2020-02-23 ENCOUNTER — Encounter: Payer: Self-pay | Admitting: Physical Therapy

## 2020-02-23 ENCOUNTER — Other Ambulatory Visit: Payer: Self-pay

## 2020-02-23 ENCOUNTER — Ambulatory Visit: Admitting: Physical Therapy

## 2020-02-23 DIAGNOSIS — R262 Difficulty in walking, not elsewhere classified: Secondary | ICD-10-CM

## 2020-02-23 DIAGNOSIS — M6281 Muscle weakness (generalized): Secondary | ICD-10-CM

## 2020-02-23 DIAGNOSIS — R296 Repeated falls: Secondary | ICD-10-CM

## 2020-02-23 NOTE — Therapy (Signed)
[facility] [address] [place], [place], [address] Phone: [phone]   Fax:  [phone]  Physical Therapy Treatment  Patient Details  Name: Alexis Jackson MRN: [id number] Date of Birth: Jul 10, 1927 Referring Provider (PT): [place], [place]   Encounter Date: 02/23/2020  PT End of Session - 02/23/20 1108    Visit Number  7    Number of Visits  13    Date for PT Re-Evaluation  03/08/20    Authorization Type  IE: 01/26/2020    PT Start Time  1102    PT Stop Time  1200    PT Time Calculation (min)  58 min    Equipment Utilized During Treatment  Gait belt    Activity Tolerance  Patient tolerated treatment well    Behavior During Therapy  [facility] for tasks assessed/performed       Past Medical History:  Diagnosis Date  . Benign essential tremor   . CAD (coronary artery disease)   . Dementia ([place])   . GAD (generalized anxiety disorder)   . GERD (gastroesophageal reflux disease)   . HLD (hyperlipidemia)   . Hypertension   . PAD (peripheral artery disease) ([place])   . Pulmonary fibrosis ([place])     Past Surgical History:  Procedure Laterality Date  . ABDOMINAL HYSTERECTOMY    . CHOLECYSTECTOMY    . CORONARY ARTERY BYPASS GRAFT    . HIP ARTHROPLASTY Left 09/18/2019   Procedure: ARTHROPLASTY BIPOLAR HIP (HEMIARTHROPLASTY) LEFT;  Surgeon: Leim Fabry, MD;  Location: [facility] ORS;  Service: Orthopedics;  Laterality: Left;  . KNEE ARTHROPLASTY      There were no vitals filed for this visit.  Subjective Assessment - 02/23/20 1106    Subjective  Patient reports that she feels a bit rushed this morning, but had a nice week. She is glad to see the cold, rainy weather is improving.    Patient is accompained by:  Family member   Son: [name]   Currently in Pain?  No/denies       TREATMENT  Neuromuscular Re-education: SPC gait, SBA/CGA, noted unsteadiness with turns and fatigue. Decreased momentum control. VCs for appropriate cane sequencing  3-point gait faded to 2 point gait. Thoracic extension over ball for improved posture and posterior chain strength x24 Patient education on basic counter exercises for balance and postural endurance.   Therapeutic Exercise: Nu-step, L2, 10 min, BUE/BLE, seat 8 for improved tolerance to activity, SpO2: 98% at end Seated LAQ, 2.5# CW, RLE 2x12, LLE 2.5# x12, 1.5# x12 Sitting hip flexion, 2.5# CW, BLE, x12  Patient educated throughout session on appropriate technique and form using multi-modal cueing, HEP, and activity modification. Patient articulated understanding and returned demonstration.   ASSESSMENT Patient presents to clinic with excellent motivation to participate in therapy. Patient demonstrates deficits in BLE strength, LLE pain, balance, gait, and function. Patient able to ambulate with SPC with SBA/CGA during today's session and responded positively to active interventions despite requiring increased rest periods. Patient will benefit from continued skilled therapeutic intervention to address remaining deficits in BLE strength, LLE pain, balance, gait, and function in order to decrease risk of falls, increase function, and improve overall QOL.     PT Long Term Goals - 01/26/20 1525      PT LONG TERM GOAL #1   Title  Patient will decrease TUG to below 14 seconds/decrease by 4 seconds using LRAD in order to demonstrate decreased fall risk.    Baseline  IE: 39.3 seconds,  rollator    Time  6    Period  Weeks    Status  New    Target Date  03/08/20      PT LONG TERM GOAL #2   Title  Patient will decrease 5TSTS by at least 3 seconds without UE support nor VCs for safe hand placement in order to demonstrate clinically significant improvement in LE strength.    Baseline  IE: 39.4 sec, BUE support, max VCs    Time  6    Period  Weeks    Status  New    Target Date  03/08/20      PT LONG TERM GOAL #3   Title  Patient will demonstrate improved function as evidenced by a score of 43  on FOTO measure for full participation in activities at home and in the community.    Baseline  IE: 20    Time  6    Period  Weeks    Status  New    Target Date  03/08/20      PT LONG TERM GOAL #4   Title  Patient will improve BERG by at least 3 points in order to demonstrate clinically significant improvement in balance.    Baseline  IE: deferred 2/2 to time    Time  6    Period  Weeks    Status  New    Target Date  03/08/20            Plan - 02/23/20 1109    Clinical Impression Statement  Patient presents to clinic with excellent motivation to participate in therapy. Patient demonstrates deficits in BLE strength, LLE pain, balance, gait, and function. Patient able to ambulate with SPC with SBA/CGA during today's session and responded positively to active interventions despite requiring increased rest periods. Patient will benefit from continued skilled therapeutic intervention to address remaining deficits in BLE strength, LLE pain, balance, gait, and function in order to decrease risk of falls, increase function, and improve overall QOL.    Personal Factors and Comorbidities  Age;Education;Sex;Social Background;Time since onset of injury/illness/exacerbation;Comorbidity 3+;Behavior Pattern;Past/Current Experience    Comorbidities  Essential hypertension, CRI (chronic renal insufficiency), stage 3 (moderate),Urinary incontinence, Generalized anxiety disorder, Chronic nausea, Idiopathic peripheral neuropathy, Dementia    Examination-Activity Limitations  Dressing;Transfers;Bend;Lift;Squat;Stairs;Locomotion Level;Carry;Continence;Toileting;Reach Overhead;Stand    Examination-Participation Restrictions  Yard Work;Personal Finances;Cleaning;Community Activity;Shop;Meal Prep;Driving    Stability/Clinical Decision Making  Evolving/Moderate complexity    Rehab Potential  Fair    PT Frequency  2x / week    PT Duration  6 weeks    PT Treatment/Interventions  ADLs/Self Care Home  Management;Canalith Repostioning;Cryotherapy;Moist Heat;DME Instruction;Gait training;Stair training;Functional mobility training;Therapeutic activities;Neuromuscular re-education;Balance training;Therapeutic exercise;Patient/family education;Orthotic Fit/Training;Manual techniques;Passive range of motion;Taping;Vestibular;Energy conservation    PT Next Visit Plan  Berg, BLE strengthening    PT Home Exercise Plan  seated: hip abduction, heel/toe raises, LAQ    Consulted and Agree with Plan of Care  Patient;Family member/caregiver    Family Member Consulted  [name] (son)       Patient will benefit from skilled therapeutic intervention in order to improve the following deficits and impairments:  Abnormal gait, Decreased balance, Decreased endurance, Decreased mobility, Difficulty walking, Postural dysfunction, Improper body mechanics, Decreased activity tolerance, Decreased coordination, Decreased safety awareness, Decreased strength, Impaired flexibility, Pain  Visit Diagnosis: Difficulty in walking, not elsewhere classified  Muscle weakness (generalized)  Repeated falls     Problem List Patient Active Problem List   Diagnosis Date Noted  .  Benign essential tremor 02/16/2020  . Chronic nausea 02/16/2020  . CRI (chronic renal insufficiency), stage 3 (moderate) 02/16/2020  . Dementia ([place]) 02/16/2020  . Hyperlipidemia 02/16/2020  . IBS (irritable bowel syndrome) 02/16/2020  . Impaired glucose tolerance 02/16/2020  . Lumbar degenerative disc disease 02/16/2020  . Osteopenia 02/16/2020  . Peripheral neuropathy 02/16/2020  . Vitamin D deficiency 02/16/2020  . Hip fracture ([place]) 09/17/2019  . HTN (hypertension) 09/17/2019  . GAD (generalized anxiety disorder) 09/17/2019  . GERD (gastroesophageal reflux disease) 09/17/2019  . CAD (coronary artery disease) 09/17/2019  . High risk medication use 08/23/2019   Myles Gip PT, DPT [phone] 02/23/2020, 12:46 PM   [facility] [address] [place], [place], [address] Phone: [phone]   Fax:  [phone]  Name: Alexis Jackson MRN: [id number] Date of Birth: 1927-06-06

## 2020-02-25 ENCOUNTER — Ambulatory Visit: Admitting: Physical Therapy

## 2020-02-25 ENCOUNTER — Encounter: Payer: Self-pay | Admitting: Physical Therapy

## 2020-02-25 ENCOUNTER — Other Ambulatory Visit: Payer: Self-pay

## 2020-02-25 DIAGNOSIS — R262 Difficulty in walking, not elsewhere classified: Secondary | ICD-10-CM

## 2020-02-25 DIAGNOSIS — R296 Repeated falls: Secondary | ICD-10-CM

## 2020-02-25 DIAGNOSIS — M6281 Muscle weakness (generalized): Secondary | ICD-10-CM

## 2020-02-25 NOTE — Therapy (Signed)
[facility] [address] [place], [place], [address] Phone: [phone]   Fax:  [phone]  Physical Therapy Treatment  Patient Details  Name: Alexis Jackson MRN: [id number] Date of Birth: 06/15/1927 Referring Provider (PT): [place], [place]   Encounter Date: 02/25/2020  PT End of Session - 02/25/20 1104    Visit Number  8    Number of Visits  13    Date for PT Re-Evaluation  03/08/20    Authorization Type  IE: 01/26/2020    PT Start Time  1100    PT Stop Time  1155    PT Time Calculation (min)  55 min    Equipment Utilized During Treatment  Gait belt    Activity Tolerance  Patient tolerated treatment well    Behavior During Therapy  [facility] for tasks assessed/performed       Past Medical History:  Diagnosis Date  . Benign essential tremor   . CAD (coronary artery disease)   . Dementia ([place])   . GAD (generalized anxiety disorder)   . GERD (gastroesophageal reflux disease)   . HLD (hyperlipidemia)   . Hypertension   . PAD (peripheral artery disease) ([place])   . Pulmonary fibrosis ([place])     Past Surgical History:  Procedure Laterality Date  . ABDOMINAL HYSTERECTOMY    . CHOLECYSTECTOMY    . CORONARY ARTERY BYPASS GRAFT    . HIP ARTHROPLASTY Left 09/18/2019   Procedure: ARTHROPLASTY BIPOLAR HIP (HEMIARTHROPLASTY) LEFT;  Surgeon: Leim Fabry, MD;  Location: [facility] ORS;  Service: Orthopedics;  Laterality: Left;  . KNEE ARTHROPLASTY      There were no vitals filed for this visit.  Subjective Assessment - 02/25/20 1103    Subjective  Patient notes that she was pretty sore after her last session but is feeling better today. Denies any falls.    Patient is accompained by:  Family member   Son: [name]   Currently in Pain?  No/denies       TREATMENT  Neuromuscular Re-education: SPC gait in // bars with faded bar support. VCs and TCs for appropriate sequencing and wider stance. SPC gait, SBA/CGA, noted unsteadiness with turns and  fatigue. Patient walking with a narrow base of support and BLE crossing midline. VCs for appropriate cane sequencing 3-point gait faded to 2 point gait.    Therapeutic Exercise: Nu-step, L3, 7 min, BUE/BLE, seat 9 for improved tolerance to activity STS, BUE support, 5x5  Patient educated throughout session on appropriate technique and form using multi-modal cueing, HEP, and activity modification. Patient articulated understanding and returned demonstration.   ASSESSMENT Patient presents to clinic with excellent motivation to participate in therapy. Patient demonstrates deficits in BLE strength, LLE pain, balance, gait, and function. Patient able to ambulate with SPC with SBA/CGA and mod-max VCs for BOS width and AD sequencing during today's session and responded positively to active interventions despite requiring increased rest periods. Patient will benefit from continued skilled therapeutic intervention to address remaining deficits in BLE strength, LLE pain, balance, gait, and function in order to decrease risk of falls, increase function, and improve overall QOL.    PT Long Term Goals - 01/26/20 1525      PT LONG TERM GOAL #1   Title  Patient will decrease TUG to below 14 seconds/decrease by 4 seconds using LRAD in order to demonstrate decreased fall risk.    Baseline  IE: 39.3 seconds, rollator    Time  6    Period  Weeks    Status  New    Target Date  03/08/20      PT LONG TERM GOAL #2   Title  Patient will decrease 5TSTS by at least 3 seconds without UE support nor VCs for safe hand placement in order to demonstrate clinically significant improvement in LE strength.    Baseline  IE: 39.4 sec, BUE support, max VCs    Time  6    Period  Weeks    Status  New    Target Date  03/08/20      PT LONG TERM GOAL #3   Title  Patient will demonstrate improved function as evidenced by a score of 43 on FOTO measure for full participation in activities at home and in the community.     Baseline  IE: 20    Time  6    Period  Weeks    Status  New    Target Date  03/08/20      PT LONG TERM GOAL #4   Title  Patient will improve BERG by at least 3 points in order to demonstrate clinically significant improvement in balance.    Baseline  IE: deferred 2/2 to time    Time  6    Period  Weeks    Status  New    Target Date  03/08/20            Plan - 02/25/20 1116    Clinical Impression Statement  Patient presents to clinic with excellent motivation to participate in therapy. Patient demonstrates deficits in BLE strength, LLE pain, balance, gait, and function. Patient able to ambulate with SPC with SBA/CGA and mod-max VCs for BOS width and AD sequencing during today's session and responded positively to active interventions despite requiring increased rest periods. Patient will benefit from continued skilled therapeutic intervention to address remaining deficits in BLE strength, LLE pain, balance, gait, and function in order to decrease risk of falls, increase function, and improve overall QOL.    Personal Factors and Comorbidities  Age;Education;Sex;Social Background;Time since onset of injury/illness/exacerbation;Comorbidity 3+;Behavior Pattern;Past/Current Experience    Comorbidities  Essential hypertension, CRI (chronic renal insufficiency), stage 3 (moderate),Urinary incontinence, Generalized anxiety disorder, Chronic nausea, Idiopathic peripheral neuropathy, Dementia    Examination-Activity Limitations  Dressing;Transfers;Bend;Lift;Squat;Stairs;Locomotion Level;Carry;Continence;Toileting;Reach Overhead;Stand    Examination-Participation Restrictions  Yard Work;Personal Finances;Cleaning;Community Activity;Shop;Meal Prep;Driving    Stability/Clinical Decision Making  Evolving/Moderate complexity    Rehab Potential  Fair    PT Frequency  2x / week    PT Duration  6 weeks    PT Treatment/Interventions  ADLs/Self Care Home Management;Canalith Repostioning;Cryotherapy;Moist  Heat;DME Instruction;Gait training;Stair training;Functional mobility training;Therapeutic activities;Neuromuscular re-education;Balance training;Therapeutic exercise;Patient/family education;Orthotic Fit/Training;Manual techniques;Passive range of motion;Taping;Vestibular;Energy conservation    PT Next Visit Plan  Berg, BLE strengthening    PT Home Exercise Plan  seated: hip abduction, heel/toe raises, LAQ    Consulted and Agree with Plan of Care  Patient;Family member/caregiver    Family Member Consulted  [name] (son)       Patient will benefit from skilled therapeutic intervention in order to improve the following deficits and impairments:  Abnormal gait, Decreased balance, Decreased endurance, Decreased mobility, Difficulty walking, Postural dysfunction, Improper body mechanics, Decreased activity tolerance, Decreased coordination, Decreased safety awareness, Decreased strength, Impaired flexibility, Pain  Visit Diagnosis: Difficulty in walking, not elsewhere classified  Muscle weakness (generalized)  Repeated falls     Problem List Patient Active Problem List   Diagnosis Date Noted  . Benign essential tremor  02/16/2020  . Chronic nausea 02/16/2020  . CRI (chronic renal insufficiency), stage 3 (moderate) 02/16/2020  . Dementia ([place]) 02/16/2020  . Hyperlipidemia 02/16/2020  . IBS (irritable bowel syndrome) 02/16/2020  . Impaired glucose tolerance 02/16/2020  . Lumbar degenerative disc disease 02/16/2020  . Osteopenia 02/16/2020  . Peripheral neuropathy 02/16/2020  . Vitamin D deficiency 02/16/2020  . Hip fracture ([place]) 09/17/2019  . HTN (hypertension) 09/17/2019  . GAD (generalized anxiety disorder) 09/17/2019  . GERD (gastroesophageal reflux disease) 09/17/2019  . CAD (coronary artery disease) 09/17/2019  . High risk medication use 08/23/2019   Myles Gip PT, DPT [phone] 02/25/2020, 12:57 PM  [facility] [address] [place], [place], [address] Phone: [phone]   Fax:  [phone]  Name: Aritha Mostyn MRN: [id number] Date of Birth: Oct 02, 1927

## 2020-03-01 ENCOUNTER — Ambulatory Visit: Payer: Medicare Other | Admitting: Physical Therapy

## 2020-03-01 ENCOUNTER — Other Ambulatory Visit: Payer: Self-pay

## 2020-03-03 ENCOUNTER — Encounter: Payer: Self-pay | Admitting: Physical Therapy

## 2020-03-03 ENCOUNTER — Other Ambulatory Visit: Payer: Self-pay

## 2020-03-03 ENCOUNTER — Ambulatory Visit: Payer: Medicare Other | Attending: Internal Medicine | Admitting: Physical Therapy

## 2020-03-03 DIAGNOSIS — R296 Repeated falls: Secondary | ICD-10-CM | POA: Insufficient documentation

## 2020-03-03 DIAGNOSIS — M6281 Muscle weakness (generalized): Secondary | ICD-10-CM | POA: Diagnosis present

## 2020-03-03 DIAGNOSIS — R262 Difficulty in walking, not elsewhere classified: Secondary | ICD-10-CM | POA: Insufficient documentation

## 2020-03-03 NOTE — Therapy (Signed)
[facility] [address] [place], [place], [address] Phone: [phone]   Fax:  [phone]  Physical Therapy Treatment  Patient Details  Name: Alexis Jackson MRN: [id number] Date of Birth: May 24, 1927 Referring Provider (PT): [place], [place]   Encounter Date: 03/03/2020  PT End of Session - 03/03/20 1106    Visit Number  9    Number of Visits  13    Date for PT Re-Evaluation  03/08/20    Authorization Type  IE: 01/26/2020    PT Start Time  1103    PT Stop Time  1158    PT Time Calculation (min)  55 min    Equipment Utilized During Treatment  Gait belt    Activity Tolerance  Patient tolerated treatment well    Behavior During Therapy  [facility] for tasks assessed/performed       Past Medical History:  Diagnosis Date  . Benign essential tremor   . CAD (coronary artery disease)   . Dementia ([place])   . GAD (generalized anxiety disorder)   . GERD (gastroesophageal reflux disease)   . HLD (hyperlipidemia)   . Hypertension   . PAD (peripheral artery disease) ([place])   . Pulmonary fibrosis ([place])     Past Surgical History:  Procedure Laterality Date  . ABDOMINAL HYSTERECTOMY    . CHOLECYSTECTOMY    . CORONARY ARTERY BYPASS GRAFT    . HIP ARTHROPLASTY Left 09/18/2019   Procedure: ARTHROPLASTY BIPOLAR HIP (HEMIARTHROPLASTY) LEFT;  Surgeon: Leim Fabry, MD;  Location: [facility] ORS;  Service: Orthopedics;  Laterality: Left;  . KNEE ARTHROPLASTY      There were no vitals filed for this visit.  Subjective Assessment - 03/03/20 1248    Subjective  Patient reports that her dizziness from Tuesday is much resolved. She is eager to review gaols today and assess her progress. Patient is interested in how much more PT she needs.    Patient is accompained by:  Family member   Son: [name]   Currently in Pain?  No/denies       TREATMENT  Neuromuscular Re-education: Reassessed goals; see below.  Gait with SPC on // bars; with and without BUE support,  with and without obstacles, SBA/CGA Standing balance with overhead reach x10, SBA/CGA Standing balance with trunk rotations x10, SBA/CGA  [facility] PT Assessment - 03/03/20 0001      Berg Balance Test   Sit to Stand  Able to stand  independently using hands    Standing Unsupported  Able to stand safely 2 minutes    Sitting with Back Unsupported but Feet Supported on Floor or Stool  Able to sit safely and securely 2 minutes    Stand to Sit  Sits safely with minimal use of hands    Transfers  Able to transfer safely, definite need of hands    Standing Unsupported with Eyes Closed  Able to stand 10 seconds safely    Standing Unsupported with Feet Together  Able to place feet together independently and stand for 1 minute with supervision    From Standing, Reach Forward with Outstretched Arm  Can reach forward >12 cm safely (5")    From Standing Position, Pick up Object from Floor  Able to pick up shoe, needs supervision    From Standing Position, Turn to Look Behind Over each Shoulder  Looks behind from both sides and weight shifts well    Turn 360 Degrees  Able to turn 360 degrees safely but slowly  Standing Unsupported, Alternately Place Feet on Step/Stool  Able to complete >2 steps/needs minimal assist    Standing Unsupported, One Foot in Front  Able to plae foot ahead of the other independently and hold 30 seconds    Standing on One Leg  Unable to try or needs assist to prevent fall    Total Score  41       Patient educated throughout session on appropriate technique and form using multi-modal cueing, HEP, and activity modification. Patient articulated understanding and returned demonstration.   ASSESSMENT Patient presents to clinic with excellent motivation to participate in therapy. Patient demonstrates deficits in BLE strength, LLE pain, balance, gait, and function. Patient performed all goal related assessments with improved tolerance, stability, and speed during today's session and  responded positively to active interventions. Patient will benefit from continued skilled therapeutic intervention to address remaining deficits in BLE strength, LLE pain, balance, gait, and function in order to decrease risk of falls, increase function, and improve overall QOL.    PT Long Term Goals - 03/03/20 1107      PT LONG TERM GOAL #1   Title  Patient will decrease TUG to below 14 seconds/decrease by 4 seconds using LRAD in order to demonstrate decreased fall risk.    Baseline  IE: 39.3 seconds, rollator; 3/4: 27.8 sec, rollator; 42.1 sec, SPC (CGA)    Time  6    Period  Weeks    Status  New    Target Date  03/08/20      PT LONG TERM GOAL #2   Title  Patient will decrease 5TSTS by at least 3 seconds without UE support nor VCs for safe hand placement in order to demonstrate clinically significant improvement in LE strength.    Baseline  IE: 39.4 sec, BUE support, max VCs; 3/4: 20.8, BUE support, no VCs    Time  6    Period  Weeks    Status  Partially Met    Target Date  03/08/20      PT LONG TERM GOAL #3   Title  Patient will demonstrate improved function as evidenced by a score of 43 on FOTO measure for full participation in activities at home and in the community.    Baseline  IE: 20; 3/4: 35    Time  6    Period  Weeks    Status  On-going    Target Date  03/08/20      PT LONG TERM GOAL #4   Title  Patient will improve BERG by at least 3 points in order to demonstrate clinically significant improvement in balance.    Baseline  IE: deferred 2/2 to time (33/56); 3/4: 41/56    Time  6    Period  Weeks    Status  New    Target Date  03/08/20            Plan - 03/03/20 1247    Clinical Impression Statement  Patient presents to clinic with excellent motivation to participate in therapy. Patient demonstrates deficits in BLE strength, LLE pain, balance, gait, and function. Patient performed all goal related assessments with improved tolerance, stability, and speed during  today's session and responded positively to active interventions. Patient will benefit from continued skilled therapeutic intervention to address remaining deficits in BLE strength, LLE pain, balance, gait, and function in order to decrease risk of falls, increase function, and improve overall QOL.    Personal Factors and Comorbidities  Age;Education;Sex;Social Background;Time since  onset of injury/illness/exacerbation;Comorbidity 3+;Behavior Pattern;Past/Current Experience    Comorbidities  Essential hypertension, CRI (chronic renal insufficiency), stage 3 (moderate),Urinary incontinence, Generalized anxiety disorder, Chronic nausea, Idiopathic peripheral neuropathy, Dementia    Examination-Activity Limitations  Dressing;Transfers;Bend;Lift;Squat;Stairs;Locomotion Level;Carry;Continence;Toileting;Reach Overhead;Stand    Examination-Participation Restrictions  Yard Work;Personal Finances;Cleaning;Community Activity;Shop;Meal Prep;Driving    Stability/Clinical Decision Making  Evolving/Moderate complexity    Rehab Potential  Fair    PT Frequency  2x / week    PT Duration  6 weeks    PT Treatment/Interventions  ADLs/Self Care Home Management;Canalith Repostioning;Cryotherapy;Moist Heat;DME Instruction;Gait training;Stair training;Functional mobility training;Therapeutic activities;Neuromuscular re-education;Balance training;Therapeutic exercise;Patient/family education;Orthotic Fit/Training;Manual techniques;Passive range of motion;Taping;Vestibular;Energy conservation    PT Next Visit Plan  Berg, BLE strengthening    PT Home Exercise Plan  seated: hip abduction, heel/toe raises, LAQ    Consulted and Agree with Plan of Care  Patient;Family member/caregiver    Family Member Consulted  [name] (son)       Patient will benefit from skilled therapeutic intervention in order to improve the following deficits and impairments:  Abnormal gait, Decreased balance, Decreased endurance, Decreased mobility,  Difficulty walking, Postural dysfunction, Improper body mechanics, Decreased activity tolerance, Decreased coordination, Decreased safety awareness, Decreased strength, Impaired flexibility, Pain  Visit Diagnosis: Difficulty in walking, not elsewhere classified  Muscle weakness (generalized)  Repeated falls     Problem List Patient Active Problem List   Diagnosis Date Noted  . Benign essential tremor 02/16/2020  . Chronic nausea 02/16/2020  . CRI (chronic renal insufficiency), stage 3 (moderate) 02/16/2020  . Dementia ([place]) 02/16/2020  . Hyperlipidemia 02/16/2020  . IBS (irritable bowel syndrome) 02/16/2020  . Impaired glucose tolerance 02/16/2020  . Lumbar degenerative disc disease 02/16/2020  . Osteopenia 02/16/2020  . Peripheral neuropathy 02/16/2020  . Vitamin D deficiency 02/16/2020  . Hip fracture ([place]) 09/17/2019  . HTN (hypertension) 09/17/2019  . GAD (generalized anxiety disorder) 09/17/2019  . GERD (gastroesophageal reflux disease) 09/17/2019  . CAD (coronary artery disease) 09/17/2019  . High risk medication use 08/23/2019   Myles Gip PT, DPT [phone] 03/03/2020, 12:52 PM  [facility] [address] [place], [place], [address] Phone: [phone]   Fax:  [phone]  Name: Evanna [place] MRN: [id number] Date of Birth: 09/23/1927

## 2020-03-03 NOTE — Progress Notes (Incomplete)
03/04/2020 2:08 PM   [name] 1927/05/16 [id number]  Referring provider: Ezequiel Kayser, MD [facility] [place],  [place] [address]  No chief complaint on file.   HPI: Alexis Jackson is a 86 yo white F who presents today for the evaluation and management of urinary incontinence. She was referred to [place] by Ezequiel Kayser, MD.   Most recent creatinine from 12/16/19 was elevated to 1.20.    1. ***  *** 2. *** *** 3. *** ***    PMH: Past Medical History:  Diagnosis Date  . Benign essential tremor   . CAD (coronary artery disease)   . Dementia ([place])   . GAD (generalized anxiety disorder)   . GERD (gastroesophageal reflux disease)   . HLD (hyperlipidemia)   . Hypertension   . PAD (peripheral artery disease) ([place])   . Pulmonary fibrosis [facility])     Surgical History: Past Surgical History:  Procedure Laterality Date  . ABDOMINAL HYSTERECTOMY    . CHOLECYSTECTOMY    . CORONARY ARTERY BYPASS GRAFT    . HIP ARTHROPLASTY Left 09/18/2019   Procedure: ARTHROPLASTY BIPOLAR HIP (HEMIARTHROPLASTY) LEFT;  Surgeon: Leim Fabry, MD;  Location: [facility] ORS;  Service: Orthopedics;  Laterality: Left;  . KNEE ARTHROPLASTY      Home Medications:  Allergies as of 03/04/2020      Reactions   Mirtazapine    Other reaction(s): Other (See Comments) Side effects, or may have been ineffective   Primidone    Other reaction(s): Other (See Comments) drowsy   Sulfa Antibiotics Rash      Medication List       Accurate as of March 03, 2020  2:08 PM. If you have any questions, ask your nurse or doctor.        amLODipine 2.5 MG tablet Commonly known as: NORVASC Take 2.5 mg by mouth daily.   aspirin EC 81 MG tablet Take 81 mg by mouth 3 (three) times a week.   busPIRone 5 MG tablet Commonly known as: BUSPAR Take 5 mg by mouth 2 (two) times daily.   clonazePAM 0.5 MG tablet Commonly known as: KLONOPIN Take 0.5 mg by mouth at bedtime as needed for anxiety.   cloNIDine 0.1 MG tablet Commonly known as: CATAPRES Take 0.1 mg by mouth as needed (BP >170).   enoxaparin 40 MG/0.4ML injection Commonly known as: LOVENOX Inject 0.4 mLs (40 mg total) into the skin daily for 14 doses.   famotidine 40 MG tablet Commonly known as: PEPCID Take 40 mg by mouth at bedtime.   gabapentin 100 MG capsule Commonly known as: NEURONTIN Take 200 mg by mouth 3 (three) times daily.   losartan-hydrochlorothiazide 100-12.5 MG tablet Commonly known as: HYZAAR Take 1 tablet by mouth daily.   ondansetron 4 MG disintegrating tablet Commonly known as: ZOFRAN-ODT Take 4 mg by mouth daily as needed for nausea.   oxyCODONE 5 MG immediate release tablet Commonly known as: Oxy IR/ROXICODONE Take 0.5-1 tablets (2.5-5 mg total) by mouth every 4 (four) hours as needed for moderate pain (pain score 4-6).   pravastatin 20 MG tablet Commonly known as: PRAVACHOL Take 20 mg by mouth every evening.   propranolol 10 MG tablet Commonly known as: INDERAL Take 1 tablet (10 mg total) by mouth 2 (two) times daily.   tamsulosin 0.4 MG Caps capsule Commonly known as: FLOMAX Take 1 capsule (0.4 mg total) by mouth daily.       Allergies:  Allergies  Allergen Reactions  .  Mirtazapine     Other reaction(s): Other (See Comments) Side effects, or may have been ineffective  . Primidone     Other reaction(s): Other (See Comments) drowsy  . Sulfa Antibiotics Rash    Family History: Family History  Problem Relation Age of Onset  . Testicular cancer Father     Social History:  reports that she has quit smoking. She has never used smokeless tobacco. She reports that she does not drink alcohol or use drugs.   Physical Exam: There were no vitals taken for this visit.  Constitutional:  Alert and oriented, No acute distress. HEENT: [place] AT, moist mucus membranes.  Trachea midline, no masses. Cardiovascular: No clubbing, cyanosis, or edema. Respiratory: Normal respiratory  effort, no increased work of breathing. GI: Abdomen is soft, nontender, nondistended, no abdominal masses GU: No CVA tenderness Lymph: No cervical or inguinal lymphadenopathy. Skin: No rashes, bruises or suspicious lesions. Neurologic: Grossly intact, no focal deficits, moving all 4 extremities. Psychiatric: Normal mood and affect.  Laboratory Data: Lab Results  Component Value Date   WBC 9.2 09/23/2019   HGB 9.2 (L) 09/23/2019   HCT 27.7 (L) 09/23/2019   MCV 90.5 09/23/2019   PLT 213 09/23/2019    Lab Results  Component Value Date   CREATININE 1.20 (H) 12/16/2019    Urinalysis No results found for: COLORURINE, APPEARANCEUR, LABSPEC, PHURINE, GLUCOSEU, HGBUR, BILIRUBINUR, KETONESUR, PROTEINUR, UROBILINOGEN, NITRITE, LEUKOCYTESUR  No results found for: LABMICR, [place], RBCUA, LABEPIT, MUCUS, BACTERIA  Pertinent Imaging: *** No results found for this or any previous visit. No results found for this or any previous visit. No results found for this or any previous visit. No results found for this or any previous visit. No results found for this or any previous visit. No results found for this or any previous visit. No results found for this or any previous visit. No results found for this or any previous visit.  Assessment & Plan:    @DIAGMED @  No follow-ups on file.  [facility] [address], [place], [place] [address] [phone]  I, Lucas Mallow, am acting as a scribe for Dr. Hollice Espy,  {Add Scribe Attestation Statement}

## 2020-03-04 ENCOUNTER — Other Ambulatory Visit: Payer: Self-pay

## 2020-03-04 ENCOUNTER — Other Ambulatory Visit
Admission: RE | Admit: 2020-03-04 | Discharge: 2020-03-04 | Disposition: A | Discharge status: Home / Self Care | Payer: Medicare HMO | Attending: Urology | Admitting: Urology

## 2020-03-04 ENCOUNTER — Ambulatory Visit: Payer: Medicare HMO | Admitting: Urology

## 2020-03-04 DIAGNOSIS — R32 Unspecified urinary incontinence: Secondary | ICD-10-CM

## 2020-03-08 ENCOUNTER — Encounter: Payer: Self-pay | Admitting: Physical Therapy

## 2020-03-08 ENCOUNTER — Ambulatory Visit: Payer: Medicare Other | Admitting: Physical Therapy

## 2020-03-08 ENCOUNTER — Other Ambulatory Visit: Payer: Self-pay

## 2020-03-08 DIAGNOSIS — R296 Repeated falls: Secondary | ICD-10-CM

## 2020-03-08 DIAGNOSIS — R262 Difficulty in walking, not elsewhere classified: Secondary | ICD-10-CM

## 2020-03-08 DIAGNOSIS — M6281 Muscle weakness (generalized): Secondary | ICD-10-CM

## 2020-03-08 NOTE — Therapy (Signed)
[facility] [address] [place], [place], [address] Phone: [phone]   Fax:  [phone]  Physical Therapy Treatment Physical Therapy Progress Note   Dates of reporting period  01/26/2020   to   03/08/2020   Patient Details  Name: Alexis Jackson MRN: [id number] Date of Birth: 09-01-1927 Referring Provider (PT): [place], [place]   Encounter Date: 03/08/2020  PT End of Session - 03/08/20 1106    Visit Number  10    Number of Visits  21    Date for PT Re-Evaluation  04/05/20    Authorization Type  IE: 01/26/2020; PN 03/08/2020    PT Start Time  1058    PT Stop Time  1152    PT Time Calculation (min)  54 min    Equipment Utilized During Treatment  Gait belt    Activity Tolerance  Patient tolerated treatment well    Behavior During Therapy  WFL for tasks assessed/performed       Past Medical History:  Diagnosis Date  . Benign essential tremor   . CAD (coronary artery disease)   . Dementia ([place])   . GAD (generalized anxiety disorder)   . GERD (gastroesophageal reflux disease)   . HLD (hyperlipidemia)   . Hypertension   . PAD (peripheral artery disease) ([place])   . Pulmonary fibrosis ([place])     Past Surgical History:  Procedure Laterality Date  . ABDOMINAL HYSTERECTOMY    . CHOLECYSTECTOMY    . CORONARY ARTERY BYPASS GRAFT    . HIP ARTHROPLASTY Left 09/18/2019   Procedure: ARTHROPLASTY BIPOLAR HIP (HEMIARTHROPLASTY) LEFT;  Surgeon: Leim Fabry, MD;  Location: [facility] ORS;  Service: Orthopedics;  Laterality: Left;  . KNEE ARTHROPLASTY      There were no vitals filed for this visit.  Subjective Assessment - 03/08/20 1105    Subjective  Patient notes that she was able to do her counterwalking exercises with SUE support and supervision at home with good safety. Patient notes that she would like to work on walking with a cane today.    Patient is accompained by:  Family member   Son: [name]   Currently in Pain?  No/denies        TREATMENT  Neuromuscular Re-education: SPC gait in // bars . VCs and TCs for appropriate sequencing and wider stance. Static standing balance with overhead reach 1.5# x10, SBA Static standing balance with rotation 1.5# x10 SBA    Therapeutic Exercise: Nu-step, L1-3, 10 min, BUE/BLE, seat 9 for improved tolerance to activity STS, no UE support, 3x5, elevated surface (one foam) 6" step taps, 1.5# CW, BLE, x10 6" step ups, 1.5# CW, BLE, x5 BUE strengthening, 3# dumbbells: bicep curls, shoulder flexion, shoulder ER x6  Patient educated throughout session on appropriate technique and form using multi-modal cueing, HEP, and activity modification. Patient articulated understanding and returned demonstration.   ASSESSMENT Patient presents to clinic with excellent motivation to participate in therapy. Patient demonstrates deficits in BLE strength, LLE pain, balance, gait, and function. Patient has progressed to steady gait with SPC SBA with occasional VCs for BOS width and AD sequencing in a closed environment. Patient has made significant progress with her goals demonstrating > than MCID changes on Berg, 5TSTS, and TUG; however she still deomnstrates unsteadiness and reliance on AD commensurate with a moderate to high falls risk. Patient's condition has the potential to improve in response to therapy. Maximum improvement is yet to be obtained. The anticipated improvement is attainable and  reasonable in a generally predictable time. Patient will benefit from continued skilled therapeutic intervention to address remaining deficits in BLE strength, LLE pain, balance, gait, and function in order to decrease risk of falls, increase function, and improve overall QOL.      PT Long Term Goals - 03/08/20 1216      PT LONG TERM GOAL #1   Title  Patient will decrease TUG to below 14 seconds/decrease by 4 seconds using LRAD in order to demonstrate decreased fall risk.    Baseline  IE: 39.3 seconds,  rollator; 3/4: 27.8 sec, rollator; 42.1 sec, SPC (CGA)    Time  4    Period  Weeks    Status  On-going    Target Date  04/05/20      PT LONG TERM GOAL #2   Title  Patient will decrease 5TSTS by at least 3 seconds without UE support nor VCs for safe hand placement in order to demonstrate clinically significant improvement in LE strength.    Baseline  IE: 39.4 sec, BUE support, max VCs; 3/4: 20.8, BUE support, no VCs    Time  4    Period  Weeks    Status  Partially Met    Target Date  04/05/20      PT LONG TERM GOAL #3   Title  Patient will demonstrate improved function as evidenced by a score of 43 on FOTO measure for full participation in activities at home and in the community.    Baseline  IE: 20; 3/4: 35    Time  4    Period  Weeks    Status  On-going    Target Date  04/05/20      PT LONG TERM GOAL #4   Title  Patient will improve BERG by at least 3 points in order to demonstrate clinically significant improvement in balance.    Baseline  IE: deferred 2/2 to time (33/56); 3/4: 41/56    Time  4    Period  Weeks    Status  On-going    Target Date  04/05/20            Plan - 03/08/20 1107    Clinical Impression Statement  Patient presents to clinic with excellent motivation to participate in therapy. Patient demonstrates deficits in BLE strength, LLE pain, balance, gait, and function. Patient has progressed to steady gait with SPC SBA with occasional VCs for BOS width and AD sequencing in a closed environment. Patient has made significant progress with her goals demonstrating > than MCID changes on Berg, 5TSTS, and TUG; however she still deomnstrates unsteadiness and reliance on AD commensurate with a moderate to high falls risk. Patient's condition has the potential to improve in response to therapy. Maximum improvement is yet to be obtained. The anticipated improvement is attainable and reasonable in a generally predictable time. Patient will benefit from continued skilled  therapeutic intervention to address remaining deficits in BLE strength, LLE pain, balance, gait, and function in order to decrease risk of falls, increase function, and improve overall QOL.    Personal Factors and Comorbidities  Age;Education;Sex;Social Background;Time since onset of injury/illness/exacerbation;Comorbidity 3+;Behavior Pattern;Past/Current Experience    Comorbidities  Essential hypertension, CRI (chronic renal insufficiency), stage 3 (moderate),Urinary incontinence, Generalized anxiety disorder, Chronic nausea, Idiopathic peripheral neuropathy, Dementia    Examination-Activity Limitations  Dressing;Transfers;Bend;Lift;Squat;Stairs;Locomotion Level;Carry;Continence;Toileting;Reach Overhead;Stand    Examination-Participation Restrictions  Yard Work;Personal Finances;Cleaning;Community Activity;Shop;Meal Prep;Driving    Stability/Clinical Decision Making  Evolving/Moderate complexity    Rehab  Potential  Fair    PT Frequency  2x / week    PT Duration  4 weeks    PT Treatment/Interventions  ADLs/Self Care Home Management;Canalith Repostioning;Cryotherapy;Moist Heat;DME Instruction;Gait training;Stair training;Functional mobility training;Therapeutic activities;Neuromuscular re-education;Balance training;Therapeutic exercise;Patient/family education;Orthotic Fit/Training;Manual techniques;Passive range of motion;Taping;Vestibular;Energy conservation    PT Next Visit Plan  SPC gait, gait with turns    PT Home Exercise Plan  seated: hip abduction, heel/toe raises, LAQ    Consulted and Agree with Plan of Care  Patient;Family member/caregiver    Family Member Consulted  [name] (son)       Patient will benefit from skilled therapeutic intervention in order to improve the following deficits and impairments:  Abnormal gait, Decreased balance, Decreased endurance, Decreased mobility, Difficulty walking, Postural dysfunction, Improper body mechanics, Decreased activity tolerance, Decreased  coordination, Decreased safety awareness, Decreased strength, Impaired flexibility, Pain  Visit Diagnosis: Difficulty in walking, not elsewhere classified  Muscle weakness (generalized)  Repeated falls     Problem List Patient Active Problem List   Diagnosis Date Noted  . Benign essential tremor 02/16/2020  . Chronic nausea 02/16/2020  . CRI (chronic renal insufficiency), stage 3 (moderate) 02/16/2020  . Dementia ([place]) 02/16/2020  . Hyperlipidemia 02/16/2020  . IBS (irritable bowel syndrome) 02/16/2020  . Impaired glucose tolerance 02/16/2020  . Lumbar degenerative disc disease 02/16/2020  . Osteopenia 02/16/2020  . Peripheral neuropathy 02/16/2020  . Vitamin D deficiency 02/16/2020  . Hip fracture ([place]) 09/17/2019  . HTN (hypertension) 09/17/2019  . GAD (generalized anxiety disorder) 09/17/2019  . GERD (gastroesophageal reflux disease) 09/17/2019  . CAD (coronary artery disease) 09/17/2019  . High risk medication use 08/23/2019   Myles Gip PT, DPT [phone] 03/08/2020, 12:17 PM  [facility] [address] [place], [place], [address] Phone: [phone]   Fax:  [phone]  Name: Alexis Jackson MRN: [id number] Date of Birth: Mar 16, 1927

## 2020-03-10 ENCOUNTER — Other Ambulatory Visit: Payer: Self-pay

## 2020-03-10 ENCOUNTER — Ambulatory Visit: Payer: Medicare Other | Admitting: Physical Therapy

## 2020-03-10 ENCOUNTER — Encounter: Payer: Self-pay | Admitting: Physical Therapy

## 2020-03-10 DIAGNOSIS — R262 Difficulty in walking, not elsewhere classified: Secondary | ICD-10-CM | POA: Diagnosis not present

## 2020-03-10 DIAGNOSIS — R296 Repeated falls: Secondary | ICD-10-CM

## 2020-03-10 DIAGNOSIS — M6281 Muscle weakness (generalized): Secondary | ICD-10-CM

## 2020-03-10 NOTE — Therapy (Signed)
[facility] [address]. [place], [place], [address] Phone: [phone]   Fax:  [phone]  Physical Therapy Treatment  Patient Details  Name: Alexis Jackson MRN: [id number] Date of Birth: 17-Mar-1927 Referring Provider (PT): [place], [place]   Encounter Date: 03/10/2020  PT End of Session - 03/10/20 1229    Visit Number  11    Number of Visits  21    Date for PT Re-Evaluation  04/05/20    Authorization Type  IE: 01/26/2020; PN 03/08/2020    PT Start Time  1100    PT Stop Time  1155    PT Time Calculation (min)  55 min    Equipment Utilized During Treatment  Gait belt    Activity Tolerance  Patient tolerated treatment well    Behavior During Therapy  [facility] for tasks assessed/performed       Past Medical History:  Diagnosis Date  . Benign essential tremor   . CAD (coronary artery disease)   . Dementia ([place])   . GAD (generalized anxiety disorder)   . GERD (gastroesophageal reflux disease)   . HLD (hyperlipidemia)   . Hypertension   . PAD (peripheral artery disease) ([place])   . Pulmonary fibrosis ([place])     Past Surgical History:  Procedure Laterality Date  . ABDOMINAL HYSTERECTOMY    . CHOLECYSTECTOMY    . CORONARY ARTERY BYPASS GRAFT    . HIP ARTHROPLASTY Left 09/18/2019   Procedure: ARTHROPLASTY BIPOLAR HIP (HEMIARTHROPLASTY) LEFT;  Surgeon: Leim Fabry, MD;  Location: [facility] ORS;  Service: Orthopedics;  Laterality: Left;  . KNEE ARTHROPLASTY      There were no vitals filed for this visit.  Subjective Assessment - 03/10/20 1228    Subjective  Patient reports that she feels she would like to be through with PT. She notes that she thought she would be walking without an AD after all this exercise. Patient's son is amenable to a trial period of self-management.    Patient is accompained by:  Family member   Son: [name]   Currently in Pain?  No/denies       TREATMENT   Therapeutic Exercise: Nu-step, L1-3, 10 min, BUE/BLE, seat 9  for improved tolerance to activity STS, no UE support, 3x5, elevated surface (one foam), 22" seat height STS, BUE support, 2x10, 19-20" seat height BUE strengthening, 3# dumbbells: bicep curls, shoulder flexion, shoulder ER x6 Gait outside the clinic on varied surfaces with rollator, SBA, good stability throughout.  Patient educated throughout session on appropriate technique and form using multi-modal cueing, HEP, and activity modification. Patient articulated understanding and returned demonstration.   ASSESSMENT Patient presents to clinic with excellent motivation to participate in therapy. Patient demonstrates deficits in BLE strength, LLE pain, balance, gait, and function. Patient able to walk on varied surfaces outside clinic with rollator and SBA with good stability and responded well to educational interventions surrounding transition to self-management/maintenance program. Patient is appropriate for trial period of self-management but may benefit from continued skilled therapeutic intervention to address remaining deficits in BLE strength, LLE pain, balance, gait, and function in order to decrease risk of falls, increase function, and improve overall QOL.     PT Long Term Goals - 03/08/20 1216      PT LONG TERM GOAL #1   Title  Patient will decrease TUG to below 14 seconds/decrease by 4 seconds using LRAD in order to demonstrate decreased fall risk.    Baseline  IE: 39.3 seconds, rollator;  3/4: 27.8 sec, rollator; 42.1 sec, SPC (CGA)    Time  4    Period  Weeks    Status  On-going    Target Date  04/05/20      PT LONG TERM GOAL #2   Title  Patient will decrease 5TSTS by at least 3 seconds without UE support nor VCs for safe hand placement in order to demonstrate clinically significant improvement in LE strength.    Baseline  IE: 39.4 sec, BUE support, max VCs; 3/4: 20.8, BUE support, no VCs    Time  4    Period  Weeks    Status  Partially Met    Target Date  04/05/20      PT  LONG TERM GOAL #3   Title  Patient will demonstrate improved function as evidenced by a score of 43 on FOTO measure for full participation in activities at home and in the community.    Baseline  IE: 20; 3/4: 35    Time  4    Period  Weeks    Status  On-going    Target Date  04/05/20      PT LONG TERM GOAL #4   Title  Patient will improve BERG by at least 3 points in order to demonstrate clinically significant improvement in balance.    Baseline  IE: deferred 2/2 to time (33/56); 3/4: 41/56    Time  4    Period  Weeks    Status  On-going    Target Date  04/05/20            Plan - 03/10/20 1229    Clinical Impression Statement  Patient presents to clinic with excellent motivation to participate in therapy. Patient demonstrates deficits in BLE strength, LLE pain, balance, gait, and function. Patient able to walk on varied surfaces outside clinic with rollator and SBA with good stability and responded well to educational interventions surrounding transition to self-management/maintenance program. Patient is appropriate for trial period of self-management but may benefit from continued skilled therapeutic intervention to address remaining deficits in BLE strength, LLE pain, balance, gait, and function in order to decrease risk of falls, increase function, and improve overall QOL.    Personal Factors and Comorbidities  Age;Education;Sex;Social Background;Time since onset of injury/illness/exacerbation;Comorbidity 3+;Behavior Pattern;Past/Current Experience    Comorbidities  Essential hypertension, CRI (chronic renal insufficiency), stage 3 (moderate),Urinary incontinence, Generalized anxiety disorder, Chronic nausea, Idiopathic peripheral neuropathy, Dementia    Examination-Activity Limitations  Dressing;Transfers;Bend;Lift;Squat;Stairs;Locomotion Level;Carry;Continence;Toileting;Reach Overhead;Stand    Examination-Participation Restrictions  Yard Work;Personal Finances;Cleaning;Community  Activity;Shop;Meal Prep;Driving    Stability/Clinical Decision Making  Evolving/Moderate complexity    Rehab Potential  Fair    PT Frequency  2x / week    PT Duration  4 weeks    PT Treatment/Interventions  ADLs/Self Care Home Management;Canalith Repostioning;Cryotherapy;Moist Heat;DME Instruction;Gait training;Stair training;Functional mobility training;Therapeutic activities;Neuromuscular re-education;Balance training;Therapeutic exercise;Patient/family education;Orthotic Fit/Training;Manual techniques;Passive range of motion;Taping;Vestibular;Energy conservation    PT Next Visit Plan  SPC gait, gait with turns    PT Home Exercise Plan  seated: hip abduction, heel/toe raises, LAQ    Consulted and Agree with Plan of Care  Patient;Family member/caregiver    Family Member Consulted  [name] (son)       Patient will benefit from skilled therapeutic intervention in order to improve the following deficits and impairments:  Abnormal gait, Decreased balance, Decreased endurance, Decreased mobility, Difficulty walking, Postural dysfunction, Improper body mechanics, Decreased activity tolerance, Decreased coordination, Decreased safety awareness, Decreased strength, Impaired flexibility,  Pain  Visit Diagnosis: Difficulty in walking, not elsewhere classified  Muscle weakness (generalized)  Repeated falls     Problem List Patient Active Problem List   Diagnosis Date Noted  . Benign essential tremor 02/16/2020  . Chronic nausea 02/16/2020  . CRI (chronic renal insufficiency), stage 3 (moderate) 02/16/2020  . Dementia ([place]) 02/16/2020  . Hyperlipidemia 02/16/2020  . IBS (irritable bowel syndrome) 02/16/2020  . Impaired glucose tolerance 02/16/2020  . Lumbar degenerative disc disease 02/16/2020  . Osteopenia 02/16/2020  . Peripheral neuropathy 02/16/2020  . Vitamin D deficiency 02/16/2020  . Hip fracture ([place]) 09/17/2019  . HTN (hypertension) 09/17/2019  . GAD (generalized anxiety  disorder) 09/17/2019  . GERD (gastroesophageal reflux disease) 09/17/2019  . CAD (coronary artery disease) 09/17/2019  . High risk medication use 08/23/2019   Myles Gip PT, DPT [phone] 03/10/2020, 12:38 PM  [facility] [address] [place], [place], [address] Phone: [phone]   Fax:  [phone]  Name: Marquisa Salih MRN: [id number] Date of Birth: [birth year]

## 2020-03-15 ENCOUNTER — Other Ambulatory Visit: Payer: Self-pay

## 2020-03-15 DIAGNOSIS — R32 Unspecified urinary incontinence: Secondary | ICD-10-CM

## 2020-03-17 NOTE — Progress Notes (Signed)
03/18/20 11:16 AM   Alexis Jackson 1927/06/24 [id number]  Referring provider: Ezequiel Kayser, MD [facility] [place],  [place] [address]  Chief Complaint  Patient presents with  . Urinary Incontinence    New Patient    HPI: Alexis Jackson is a 86 y.o. white F with hx of postoperative urinary retention after hip fracture presents today for the evaluation and management of urinary incontinence. She was referred to [place] by Ezequiel Kayser, MD. She was accompanied by a family member today.   She reports of having Urodynamics done in the past.   She was supposed to f/u with [place] after admission of hip fracture but has not till this day. She was started on Flomax when she was put on retention.   UA on 1/20 negative for infection.   CT from [place] in August showed kidney scarring, kidney atrophy, no stones, benign bladder and no abnormal lymph nodes.   She reports of incontinence when she wakes up onset 1 year ago. She is aware of when she needs to go to restroom and usually makes it on time. She uses the bathroom when she does not need to in order to avoid incontinence. She changes pads once a day. Incontinence is more of an emotional distress rather than physical for her given new household circumstances.   She currently is not on any medications for incontinence.  She has been having trouble with infections since 8-9 months ago however no documented infections except for urine culture below.   No issues with constipation or bowel movements. Also denies history of kidney stones.   Never smoker.   +Urine culture 11/26/19 indicative of E. [place]ae resistant to cefazolin, cefuroxime,   PMH: Past Medical History:  Diagnosis Date  . Benign essential tremor   . CAD (coronary artery disease)   . Dementia ([place])   . GAD (generalized anxiety disorder)   . GERD (gastroesophageal reflux disease)   . HLD (hyperlipidemia)   . Hypertension   . PAD (peripheral artery disease)  ([place])   . Pulmonary fibrosis [facility])     Surgical History: Past Surgical History:  Procedure Laterality Date  . ABDOMINAL HYSTERECTOMY    . CHOLECYSTECTOMY    . CORONARY ARTERY BYPASS GRAFT    . HIP ARTHROPLASTY Left 09/18/2019   Procedure: ARTHROPLASTY BIPOLAR HIP (HEMIARTHROPLASTY) LEFT;  Surgeon: Leim Fabry, MD;  Location: [facility] ORS;  Service: Orthopedics;  Laterality: Left;  . KNEE ARTHROPLASTY      Home Medications:  Allergies as of 03/18/2020      Reactions   Mirtazapine    Other reaction(s): Other (See Comments) Side effects, or may have been ineffective   Primidone    Other reaction(s): Other (See Comments) drowsy   Sulfa Antibiotics Rash      Medication List       Accurate as of March 18, 2020 11:59 PM. If you have any questions, ask your nurse or doctor.        STOP taking these medications   enoxaparin 40 MG/0.4ML injection Commonly known as: LOVENOX Stopped by: Hollice Espy, MD   tamsulosin 0.4 MG Caps capsule Commonly known as: FLOMAX Stopped by: Hollice Espy, MD     TAKE these medications   amLODipine 2.5 MG tablet Commonly known as: NORVASC Take 2.5 mg by mouth daily.   aspirin EC 81 MG tablet Take 81 mg by mouth 3 (three) times a week.   busPIRone 5 MG tablet Commonly known as: BUSPAR Take  5 mg by mouth 2 (two) times daily.   carbidopa-levodopa 25-100 MG tablet Commonly known as: SINEMET IR   Cholecalciferol 25 MCG (1000 UT) tablet Take by mouth.   clonazePAM 0.5 MG tablet Commonly known as: KLONOPIN Take 0.5 mg by mouth at bedtime as needed for anxiety.   cloNIDine 0.1 MG tablet Commonly known as: CATAPRES Take 0.1 mg by mouth as needed (BP >170).   famotidine 40 MG tablet Commonly known as: PEPCID Take 40 mg by mouth at bedtime.   gabapentin 300 MG capsule Commonly known as: NEURONTIN Take by mouth. What changed: Another medication with the same name was removed. Continue taking this medication, and follow the directions  you see here. Changed by: Hollice Espy, MD   LORazepam 0.5 MG tablet Commonly known as: ATIVAN Ativan oral 0.5              mg 0.5   mg EVERY 4 HOURS PRN ANXIETY   losartan-hydrochlorothiazide 100-12.5 MG tablet Commonly known as: HYZAAR Take 1 tablet by mouth daily.   ondansetron 4 MG disintegrating tablet Commonly known as: ZOFRAN-ODT Take 4 mg by mouth daily as needed for nausea.   oxyCODONE 5 MG immediate release tablet Commonly known as: Oxy IR/ROXICODONE Take 0.5-1 tablets (2.5-5 mg total) by mouth every 4 (four) hours as needed for moderate pain (pain score 4-6).   pravastatin 20 MG tablet Commonly known as: PRAVACHOL Take 20 mg by mouth every evening.   propranolol 10 MG tablet Commonly known as: INDERAL Take 1 tablet (10 mg total) by mouth 2 (two) times daily.   Turmeric Curcumin 500 MG Caps Take by mouth.       Allergies:  Allergies  Allergen Reactions  . Mirtazapine     Other reaction(s): Other (See Comments) Side effects, or may have been ineffective  . Primidone     Other reaction(s): Other (See Comments) drowsy  . Sulfa Antibiotics Rash    Family History: Family History  Problem Relation Age of Onset  . Testicular cancer Father     Social History:  reports that she has quit smoking. She has never used smokeless tobacco. She reports that she does not drink alcohol or use drugs.   Physical Exam: BP 106/64   Pulse 79   Ht 5\' 7"  (1.702 m)   Wt 120 lb (54.4 kg)   BMI 18.79 kg/m   Constitutional:  Alert and oriented, No acute distress. HEENT:  AT, moist mucus membranes.  Trachea midline, no masses. Cardiovascular: No clubbing, cyanosis, or edema. Respiratory: Normal respiratory effort, no increased work of breathing. Skin: No rashes, bruises or suspicious lesions. Neurologic: Grossly intact, no focal deficits, moving all 4 extremities. Psychiatric: Normal mood and affect.  Pertinent Imaging: Results for orders placed or performed  during the hospital encounter of 03/18/20  Urinalysis, Complete w Microscopic (For [facility] ONLY)  Result Value Ref Range   Color, Urine YELLOW YELLOW   APPearance CLEAR CLEAR   Specific Gravity, Urine 1.015 1.005 - 1.030   pH 7.0 5.0 - 8.0   Glucose, UA NEGATIVE NEGATIVE mg/dL   Hgb urine dipstick NEGATIVE NEGATIVE   Bilirubin Urine NEGATIVE NEGATIVE   Ketones, ur NEGATIVE NEGATIVE mg/dL   Protein, ur NEGATIVE NEGATIVE mg/dL   Nitrite NEGATIVE NEGATIVE   Leukocytes,Ua NEGATIVE NEGATIVE   Squamous Epithelial / LPF 0-5 0 - 5   WBC, UA 0-5 0 - 5 WBC/hpf   RBC / HPF NONE SEEN 0 - 5 RBC/hpf   Bacteria, UA  RARE (A) NONE SEEN   Reviewed outside CT scans. See Epic.    Assessment & Plan:    1. Urge incontinence Adequate emptying of bladder  Discontinue Flomax given her age and side effects Reviewed oustide CT scans noted atrophic kidney, benign bladder and no abnormal lymph nodes. Benign.  Suspect long standing problem given she had a Urodynamics done in the past- will attempt to get records Exacerbated by social anxiety with new household circumstances. Reassured pt myself and family member that we support what she believes is best for her.  Given Myrbetriq daily samples. Notified pt regarding no confusion and dry mouth as side effects of this medication.   Return in 6 week with Carlis Stable for BP/PVR check   [facility] [address], [place], [place] [address] [phone]  I, Lucas Mallow, am acting as a scribe for Dr. Hollice Espy,  I have reviewed the above documentation for accuracy and completeness, and I agree with the above.   Hollice Espy, MD

## 2020-03-18 ENCOUNTER — Other Ambulatory Visit
Admission: RE | Admit: 2020-03-18 | Discharge: 2020-03-18 | Disposition: A | Discharge status: Home / Self Care | Attending: Urology | Admitting: Urology

## 2020-03-18 ENCOUNTER — Encounter: Payer: Self-pay | Admitting: Urology

## 2020-03-18 ENCOUNTER — Other Ambulatory Visit: Payer: Self-pay

## 2020-03-18 ENCOUNTER — Ambulatory Visit: Payer: Medicare HMO | Admitting: Urology

## 2020-03-18 VITALS — BP 106/64 | HR 79 | Ht 67.0 in | Wt 120.0 lb

## 2020-03-18 DIAGNOSIS — R32 Unspecified urinary incontinence: Secondary | ICD-10-CM | POA: Diagnosis not present

## 2020-03-18 DIAGNOSIS — Z2883 Immunization not carried out due to unavailability of vaccine: Secondary | ICD-10-CM

## 2020-03-18 LAB — URINALYSIS, COMPLETE (UACMP) WITH MICROSCOPIC
Bilirubin Urine: NEGATIVE
Glucose, UA: NEGATIVE mg/dL
Hgb urine dipstick: NEGATIVE
Ketones, ur: NEGATIVE mg/dL
Leukocytes,Ua: NEGATIVE
Nitrite: NEGATIVE
Protein, ur: NEGATIVE mg/dL
RBC / HPF: NONE SEEN RBC/hpf (ref 0–5)
Specific Gravity, Urine: 1.015 (ref 1.005–1.030)
pH: 7 (ref 5.0–8.0)

## 2020-03-18 LAB — BLADDER SCAN AMB NON-IMAGING: Scan Result: 20

## 2020-03-22 ENCOUNTER — Ambulatory Visit: Payer: Medicare Other | Admitting: Physical Therapy

## 2020-04-05 ENCOUNTER — Other Ambulatory Visit: Payer: Self-pay | Admitting: *Deleted

## 2020-04-05 MED ORDER — MIRABEGRON ER 50 MG PO TB24: 50.0000 mg | ORAL_TABLET | Freq: Every day | ORAL | 11 refills | Status: DC

## 2020-04-05 NOTE — Progress Notes (Signed)
Patient son called in and the sample of Myrbetriq 50mg  worked great . Send in RX

## 2020-04-19 DIAGNOSIS — I25118 Atherosclerotic heart disease of native coronary artery with other forms of angina pectoris: Secondary | ICD-10-CM | POA: Diagnosis not present

## 2020-04-19 DIAGNOSIS — E785 Hyperlipidemia, unspecified: Secondary | ICD-10-CM | POA: Diagnosis not present

## 2020-04-19 DIAGNOSIS — N183 Chronic kidney disease, stage 3 unspecified: Secondary | ICD-10-CM | POA: Diagnosis not present

## 2020-04-19 DIAGNOSIS — K219 Gastro-esophageal reflux disease without esophagitis: Secondary | ICD-10-CM | POA: Diagnosis not present

## 2020-04-19 DIAGNOSIS — D649 Anemia, unspecified: Secondary | ICD-10-CM | POA: Diagnosis not present

## 2020-04-19 DIAGNOSIS — F0390 Unspecified dementia without behavioral disturbance: Secondary | ICD-10-CM | POA: Diagnosis not present

## 2020-04-19 DIAGNOSIS — I700 Atherosclerosis of aorta: Secondary | ICD-10-CM | POA: Diagnosis not present

## 2020-04-19 DIAGNOSIS — Z79899 Other long term (current) drug therapy: Secondary | ICD-10-CM | POA: Diagnosis not present

## 2020-04-19 DIAGNOSIS — R7302 Impaired glucose tolerance (oral): Secondary | ICD-10-CM | POA: Diagnosis not present

## 2020-04-19 DIAGNOSIS — I129 Hypertensive chronic kidney disease with stage 1 through stage 4 chronic kidney disease, or unspecified chronic kidney disease: Secondary | ICD-10-CM | POA: Diagnosis not present

## 2020-04-19 DIAGNOSIS — I499 Cardiac arrhythmia, unspecified: Secondary | ICD-10-CM | POA: Diagnosis not present

## 2020-04-19 DIAGNOSIS — I739 Peripheral vascular disease, unspecified: Secondary | ICD-10-CM | POA: Diagnosis not present

## 2020-04-19 DIAGNOSIS — I6529 Occlusion and stenosis of unspecified carotid artery: Secondary | ICD-10-CM | POA: Diagnosis not present

## 2020-04-21 DIAGNOSIS — R251 Tremor, unspecified: Secondary | ICD-10-CM | POA: Diagnosis not present

## 2020-04-21 DIAGNOSIS — M25562 Pain in left knee: Secondary | ICD-10-CM | POA: Diagnosis not present

## 2020-04-21 DIAGNOSIS — M25561 Pain in right knee: Secondary | ICD-10-CM | POA: Diagnosis not present

## 2020-05-02 ENCOUNTER — Ambulatory Visit (INDEPENDENT_AMBULATORY_CARE_PROVIDER_SITE_OTHER): Payer: Medicare HMO | Admitting: Urology

## 2020-05-02 ENCOUNTER — Other Ambulatory Visit: Payer: Self-pay

## 2020-05-02 ENCOUNTER — Encounter: Payer: Self-pay | Admitting: Urology

## 2020-05-02 VITALS — BP 108/61 | HR 121 | Ht 67.0 in | Wt 120.0 lb

## 2020-05-02 DIAGNOSIS — R32 Unspecified urinary incontinence: Secondary | ICD-10-CM

## 2020-05-02 LAB — BLADDER SCAN AMB NON-IMAGING: Scan Result: 12

## 2020-05-02 MED ORDER — MIRABEGRON ER 50 MG PO TB24: 50.0000 mg | ORAL_TABLET | Freq: Every day | ORAL | 3 refills | Status: AC

## 2020-05-02 NOTE — Progress Notes (Signed)
05/02/2020 3:08 PM   [name] 1927-01-20 [id number]  Referring provider: Ezequiel Kayser, MD [place] [facility] [place],  [place] [address]  Chief Complaint  Patient presents with  . Urinary Incontinence    HPI: [name] is a 86 year old female with dementia and urge incontinence who presents today for a 6 week follow up after a trial of Myrbetriq with Cecille Rubin, her son's significant other.    She was seen by Dr. Erlene Quan on March 18, 2020.  At that visit she was started on Myrbetriq 50 mg daily.  Today, her blood pressure is stable at 108/61.  Her PVR is 12 mL.  She states that she has felt the Myrbetriq has decreased her volume and frequency of incontinence.  Patient denies any modifying or aggravating factors.  Patient denies any gross hematuria, dysuria or suprapubic/flank pain.  Patient denies any fevers, chills, nausea or vomiting.    PMH: Past Medical History:  Diagnosis Date  . Benign essential tremor   . CAD (coronary artery disease)   . Dementia ([place])   . GAD (generalized anxiety disorder)   . GERD (gastroesophageal reflux disease)   . HLD (hyperlipidemia)   . Hypertension   . PAD (peripheral artery disease) ([place])   . Pulmonary fibrosis [facility])     Surgical History: Past Surgical History:  Procedure Laterality Date  . ABDOMINAL HYSTERECTOMY    . CHOLECYSTECTOMY    . CORONARY ARTERY BYPASS GRAFT    . HIP ARTHROPLASTY Left 09/18/2019   Procedure: ARTHROPLASTY BIPOLAR HIP (HEMIARTHROPLASTY) LEFT;  Surgeon: Leim Fabry, MD;  Location: [facility] ORS;  Service: Orthopedics;  Laterality: Left;  . KNEE ARTHROPLASTY      Home Medications:  Allergies as of 05/02/2020      Reactions   Mirtazapine    Other reaction(s): Other (See Comments) Side effects, or may have been ineffective   Primidone    Other reaction(s): Other (See Comments) drowsy   Sulfa Antibiotics Rash      Medication List       Accurate as of May 02, 2020  3:08 PM. If you have  any questions, ask your nurse or doctor.        amLODipine 2.5 MG tablet Commonly known as: NORVASC Take 2.5 mg by mouth daily.   aspirin EC 81 MG tablet Take 81 mg by mouth 3 (three) times a week.   busPIRone 5 MG tablet Commonly known as: BUSPAR Take 5 mg by mouth 2 (two) times daily.   carbidopa-levodopa 25-100 MG tablet Commonly known as: SINEMET IR   Cholecalciferol 25 MCG (1000 UT) tablet Take by mouth.   clonazePAM 0.5 MG tablet Commonly known as: KLONOPIN Take 0.5 mg by mouth at bedtime as needed for anxiety.   cloNIDine 0.1 MG tablet Commonly known as: CATAPRES Take 0.1 mg by mouth as needed (BP >170).   famotidine 40 MG tablet Commonly known as: PEPCID Take 40 mg by mouth at bedtime.   gabapentin 300 MG capsule Commonly known as: NEURONTIN Take by mouth.   LORazepam 0.5 MG tablet Commonly known as: ATIVAN Ativan oral 0.5              mg 0.5   mg EVERY 4 HOURS PRN ANXIETY   losartan-hydrochlorothiazide 100-12.5 MG tablet Commonly known as: HYZAAR Take 1 tablet by mouth daily.   mirabegron ER 50 MG Tb24 tablet Commonly known as: MYRBETRIQ Take 1 tablet (50 mg total) by mouth daily.   ondansetron  4 MG disintegrating tablet Commonly known as: ZOFRAN-ODT Take 4 mg by mouth daily as needed for nausea.   oxyCODONE 5 MG immediate release tablet Commonly known as: Oxy IR/ROXICODONE Take 0.5-1 tablets (2.5-5 mg total) by mouth every 4 (four) hours as needed for moderate pain (pain score 4-6).   pravastatin 20 MG tablet Commonly known as: PRAVACHOL Take 20 mg by mouth every evening.   propranolol 10 MG tablet Commonly known as: INDERAL Take 1 tablet (10 mg total) by mouth 2 (two) times daily.   Turmeric Curcumin 500 MG Caps Take by mouth.       Allergies:  Allergies  Allergen Reactions  . Mirtazapine     Other reaction(s): Other (See Comments) Side effects, or may have been ineffective  . Primidone     Other reaction(s): Other (See  Comments) drowsy  . Sulfa Antibiotics Rash    Family History: Family History  Problem Relation Age of Onset  . Testicular cancer Father     Social History:  reports that she has quit smoking. She has never used smokeless tobacco. She reports that she does not drink alcohol or use drugs.  ROS: Pertinent ROS in HPI  Physical Exam: BP 108/61   Pulse (!) 121   Ht 5\' 7"  (1.702 m)   Wt 120 lb (54.4 kg)   BMI 18.79 kg/m   Constitutional:  Well nourished. Alert. No acute distress. HEENT: [place] AT, mask in place.  Trachea midline. Cardiovascular: No clubbing, cyanosis, or edema. Respiratory: Normal respiratory effort, no increased work of breathing. Neurologic: Grossly intact, no focal deficits, moving all 4 extremities. Psychiatric: Normal mood and affect.  Laboratory Data: Lab Results  Component Value Date   WBC 9.2 09/23/2019   HGB 9.2 (L) 09/23/2019   HCT 27.7 (L) 09/23/2019   MCV 90.5 09/23/2019   PLT 213 09/23/2019    Lab Results  Component Value Date   CREATININE 1.20 (H) 12/16/2019    Urinalysis    Component Value Date/Time   COLORURINE YELLOW 03/18/2020 1516   APPEARANCEUR CLEAR 03/18/2020 1516   LABSPEC 1.015 03/18/2020 1516   PHURINE 7.0 03/18/2020 1516   GLUCOSEU NEGATIVE 03/18/2020 1516   HGBUR NEGATIVE 03/18/2020 1516   [place] NEGATIVE 03/18/2020 1516   KETONESUR NEGATIVE 03/18/2020 1516   PROTEINUR NEGATIVE 03/18/2020 1516   NITRITE NEGATIVE 03/18/2020 1516   LEUKOCYTESUR NEGATIVE 03/18/2020 1516    I have reviewed the labs.   Pertinent Imaging: Results for KAMARYN, ANTES (MRN [id number]) as of 05/02/2020 15:07  Ref. Range 05/02/2020 14:43  Scan Result Unknown 12    Assessment & Plan:    1. Urinary incontinence, unspecified type - Bladder Scan (Post Void Residual) in office - Satisfied with results with the Myrbetriq 50 mg daily at this time -Return to clinic in 6 months for PVR and blood pressure check  Return in about 6 months (around  11/02/2020) for PVR and OAB questionnaire.  These notes generated with voice recognition software. I apologize for typographical errors.  Zara Council, PA-C  [facility] [address]  [place], [place] [address] [phone]

## 2020-05-17 DIAGNOSIS — I739 Peripheral vascular disease, unspecified: Secondary | ICD-10-CM | POA: Diagnosis not present

## 2020-05-17 DIAGNOSIS — L851 Acquired keratosis [keratoderma] palmaris et plantaris: Secondary | ICD-10-CM | POA: Diagnosis not present

## 2020-05-17 DIAGNOSIS — B351 Tinea unguium: Secondary | ICD-10-CM | POA: Diagnosis not present

## 2020-05-18 DIAGNOSIS — L570 Actinic keratosis: Secondary | ICD-10-CM | POA: Diagnosis not present

## 2020-05-18 DIAGNOSIS — D485 Neoplasm of uncertain behavior of skin: Secondary | ICD-10-CM | POA: Diagnosis not present

## 2020-05-18 DIAGNOSIS — C44529 Squamous cell carcinoma of skin of other part of trunk: Secondary | ICD-10-CM | POA: Diagnosis not present

## 2020-05-18 DIAGNOSIS — D045 Carcinoma in situ of skin of trunk: Secondary | ICD-10-CM | POA: Diagnosis not present

## 2020-05-18 DIAGNOSIS — X32XXXA Exposure to sunlight, initial encounter: Secondary | ICD-10-CM | POA: Diagnosis not present

## 2020-06-23 DIAGNOSIS — C44529 Squamous cell carcinoma of skin of other part of trunk: Secondary | ICD-10-CM | POA: Diagnosis not present

## 2020-06-23 DIAGNOSIS — D0462 Carcinoma in situ of skin of left upper limb, including shoulder: Secondary | ICD-10-CM | POA: Diagnosis not present

## 2020-06-28 DIAGNOSIS — M1611 Unilateral primary osteoarthritis, right hip: Secondary | ICD-10-CM | POA: Diagnosis not present

## 2020-06-28 DIAGNOSIS — M25551 Pain in right hip: Secondary | ICD-10-CM | POA: Diagnosis not present

## 2020-08-31 DEATH — deceased

## 2020-10-31 ENCOUNTER — Ambulatory Visit: Payer: Medicare HMO | Admitting: Urology

## 2020-11-15 IMAGING — CT CT HIP*L* W/O CM
2 of 3 series · 17 of 46 positions shown, 19 images · non-contrast
Comparison: Radiographs dated 09/17/2019

CLINICAL DATA: Left hip fracture secondary to a fall at home.

EXAM:
CT OF THE LEFT HIP WITHOUT CONTRAST
TECHNIQUE: Multidetector CT imaging of the left hip was performed according to
the standard protocol. Multiplanar CT image reconstructions were
also generated.

[Series 3: axial st · axial · 0.47mm/px · z∈[-1287,-1099]mm · 14 of 110 slices shown, 16 images]
[im 8/110  soft-tissue]
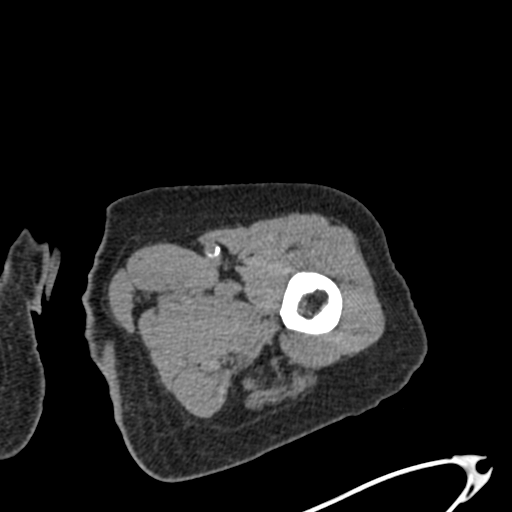
[im 8/110  bone]
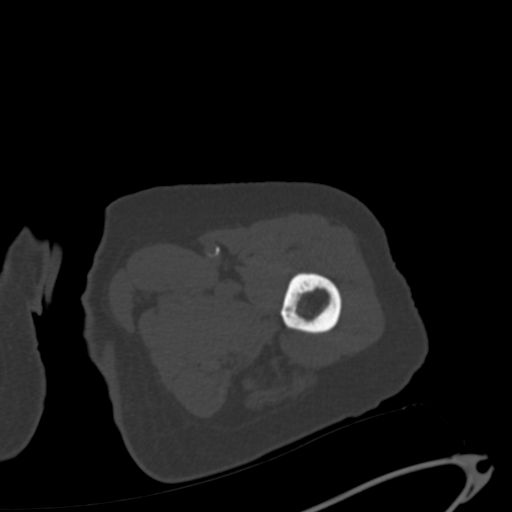
[im 15/110  soft-tissue]
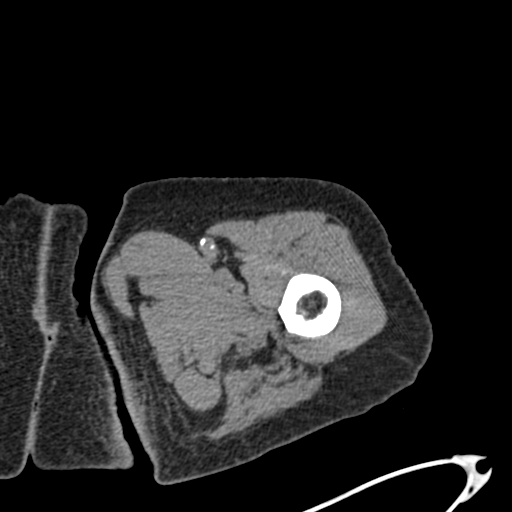
[im 22/110  soft-tissue]
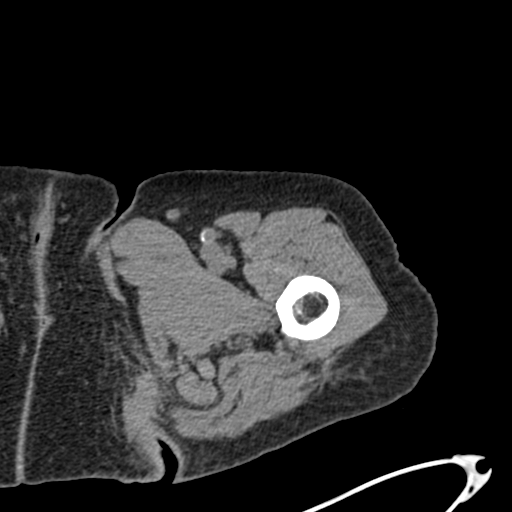
[im 29/110  soft-tissue]
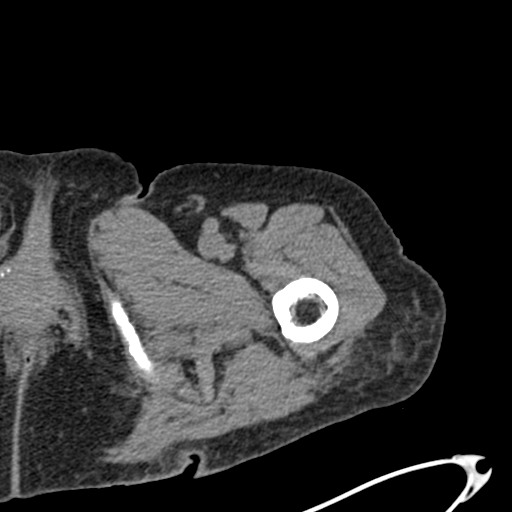
[im 36/110  soft-tissue]
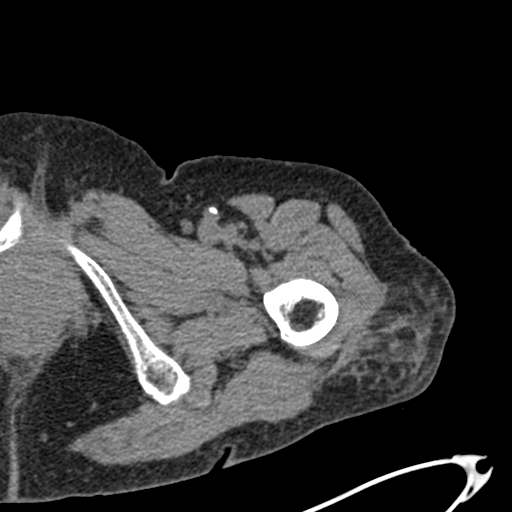
[im 43/110  soft-tissue]
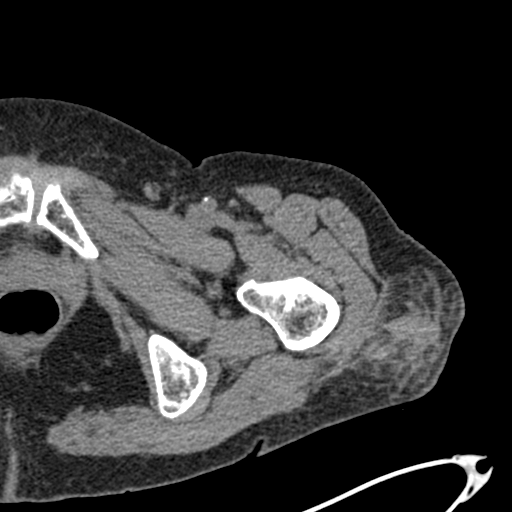
[im 50/110  soft-tissue]
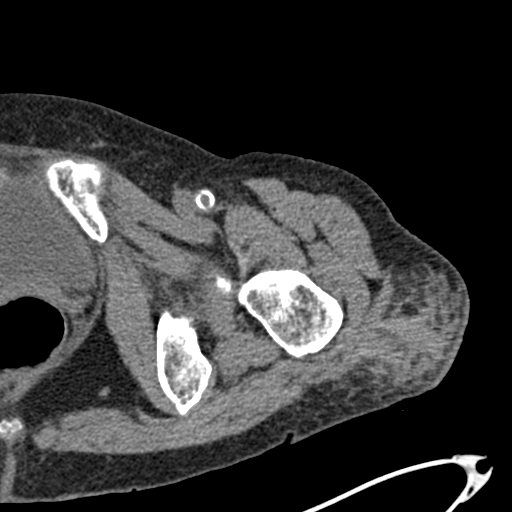
[im 60/110  soft-tissue]
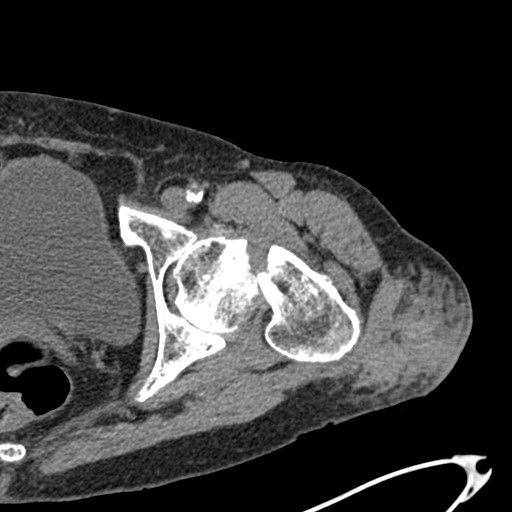
[im 67/110  soft-tissue]
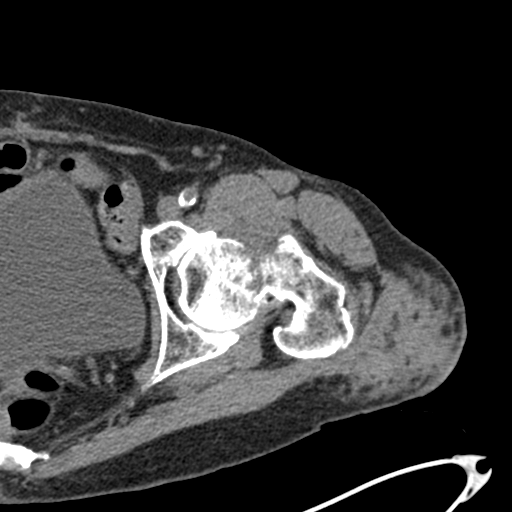
[im 67/110  bone]
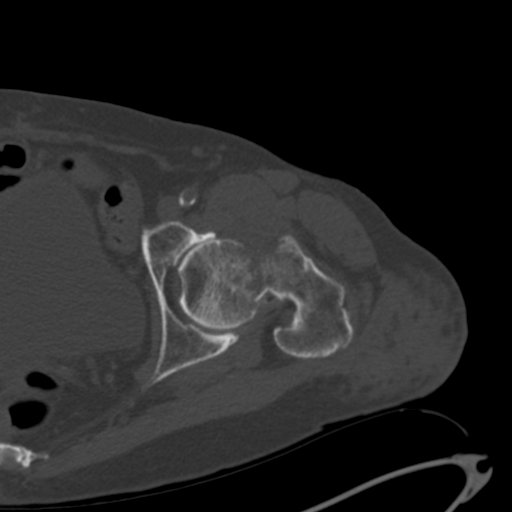
[im 74/110  soft-tissue]
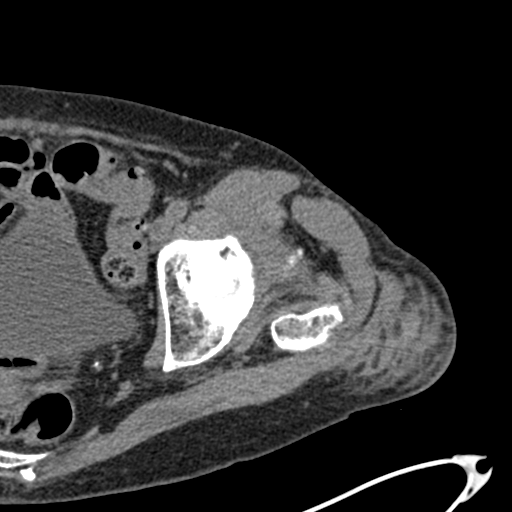
[im 81/110  soft-tissue]
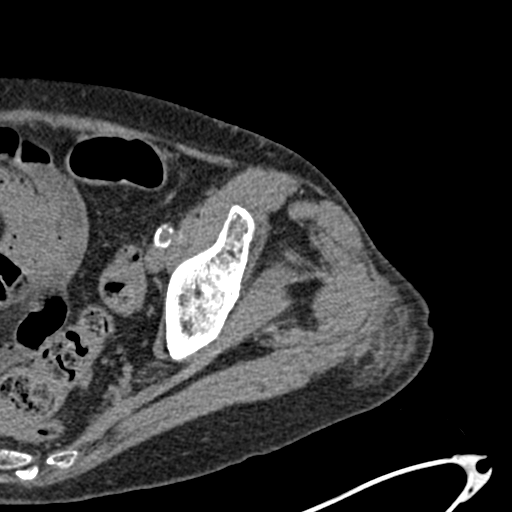
[im 88/110  soft-tissue]
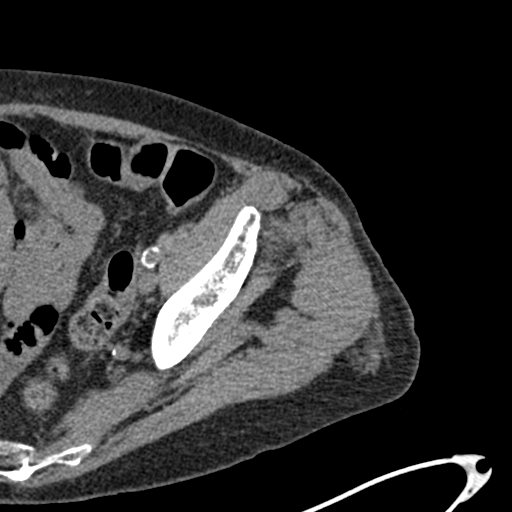
[im 95/110  soft-tissue]
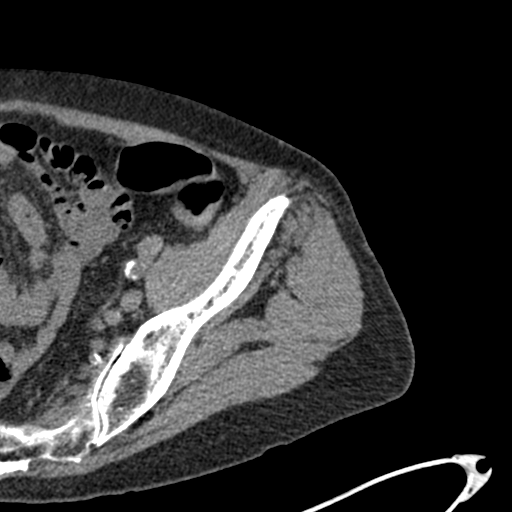
[im 102/110  soft-tissue]
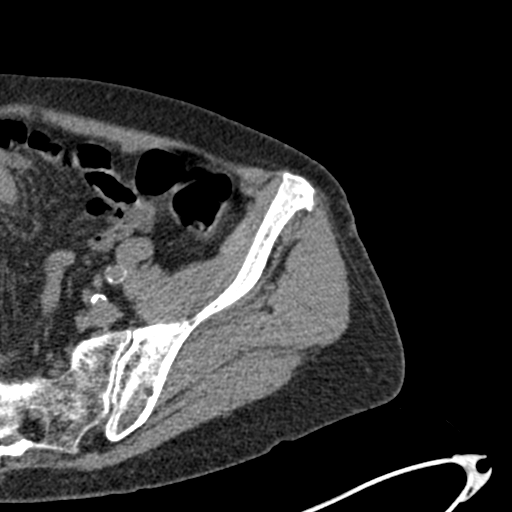

[Series 6: coronal st · coronal · 0.43mm/px · 3 of 80 slices shown]
[im 27/80  soft-tissue]
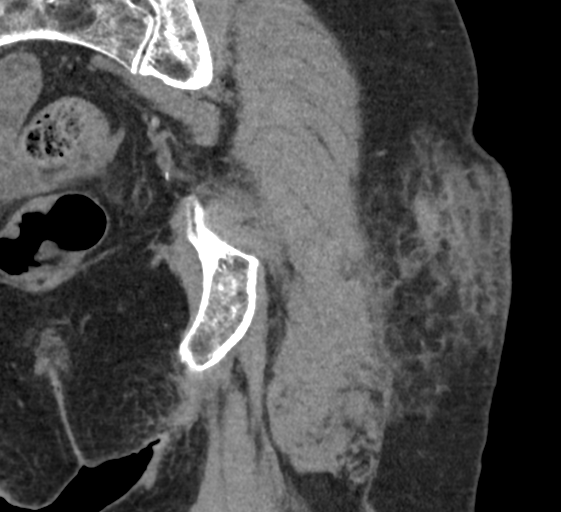
[im 36/80  soft-tissue]
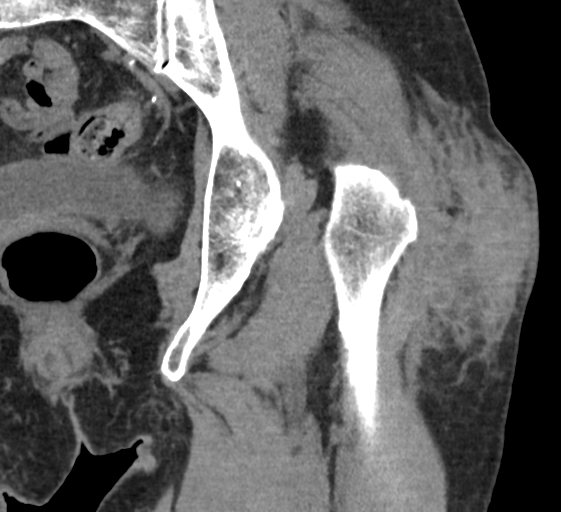
[im 44/80  soft-tissue]
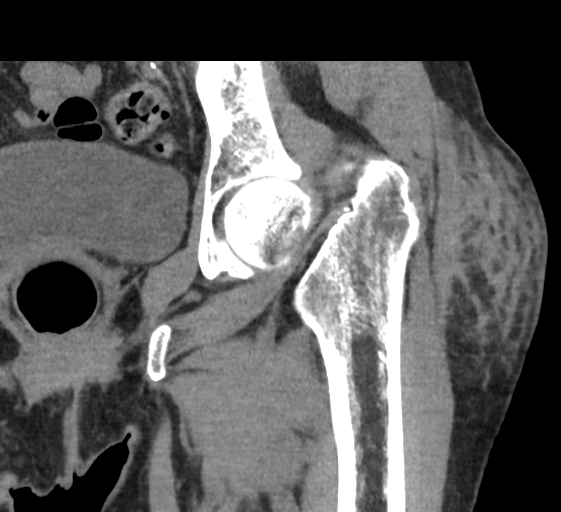

[17 of 46 positions shown; findings below may reference images not displayed]

FINDINGS: Bones/Joint/Cartilage

There is a slightly comminuted angulated overriding benign-appearing
fracture of the left femoral neck.

Muscles and Tendons

Normal.

Soft tissues

Prominent contusion of the subcutaneous fat lateral to the left
greater trochanter.
IMPRESSION: 1. Slightly comminuted angulated overriding benign-appearing
fracture of the left femoral neck.
2. Prominent contusion of the subcutaneous fat lateral to the left
greater trochanter.

## 2020-11-16 IMAGING — DX DG PELVIS 1-2V
2 series · 2 of 2 positions shown · non-contrast
Comparison: CT 09/17/2019.

CLINICAL DATA: Hemiarthroplasty.

EXAM:
PELVIS - 1-2 VIEW

[pelvis ap (1 of 2)]
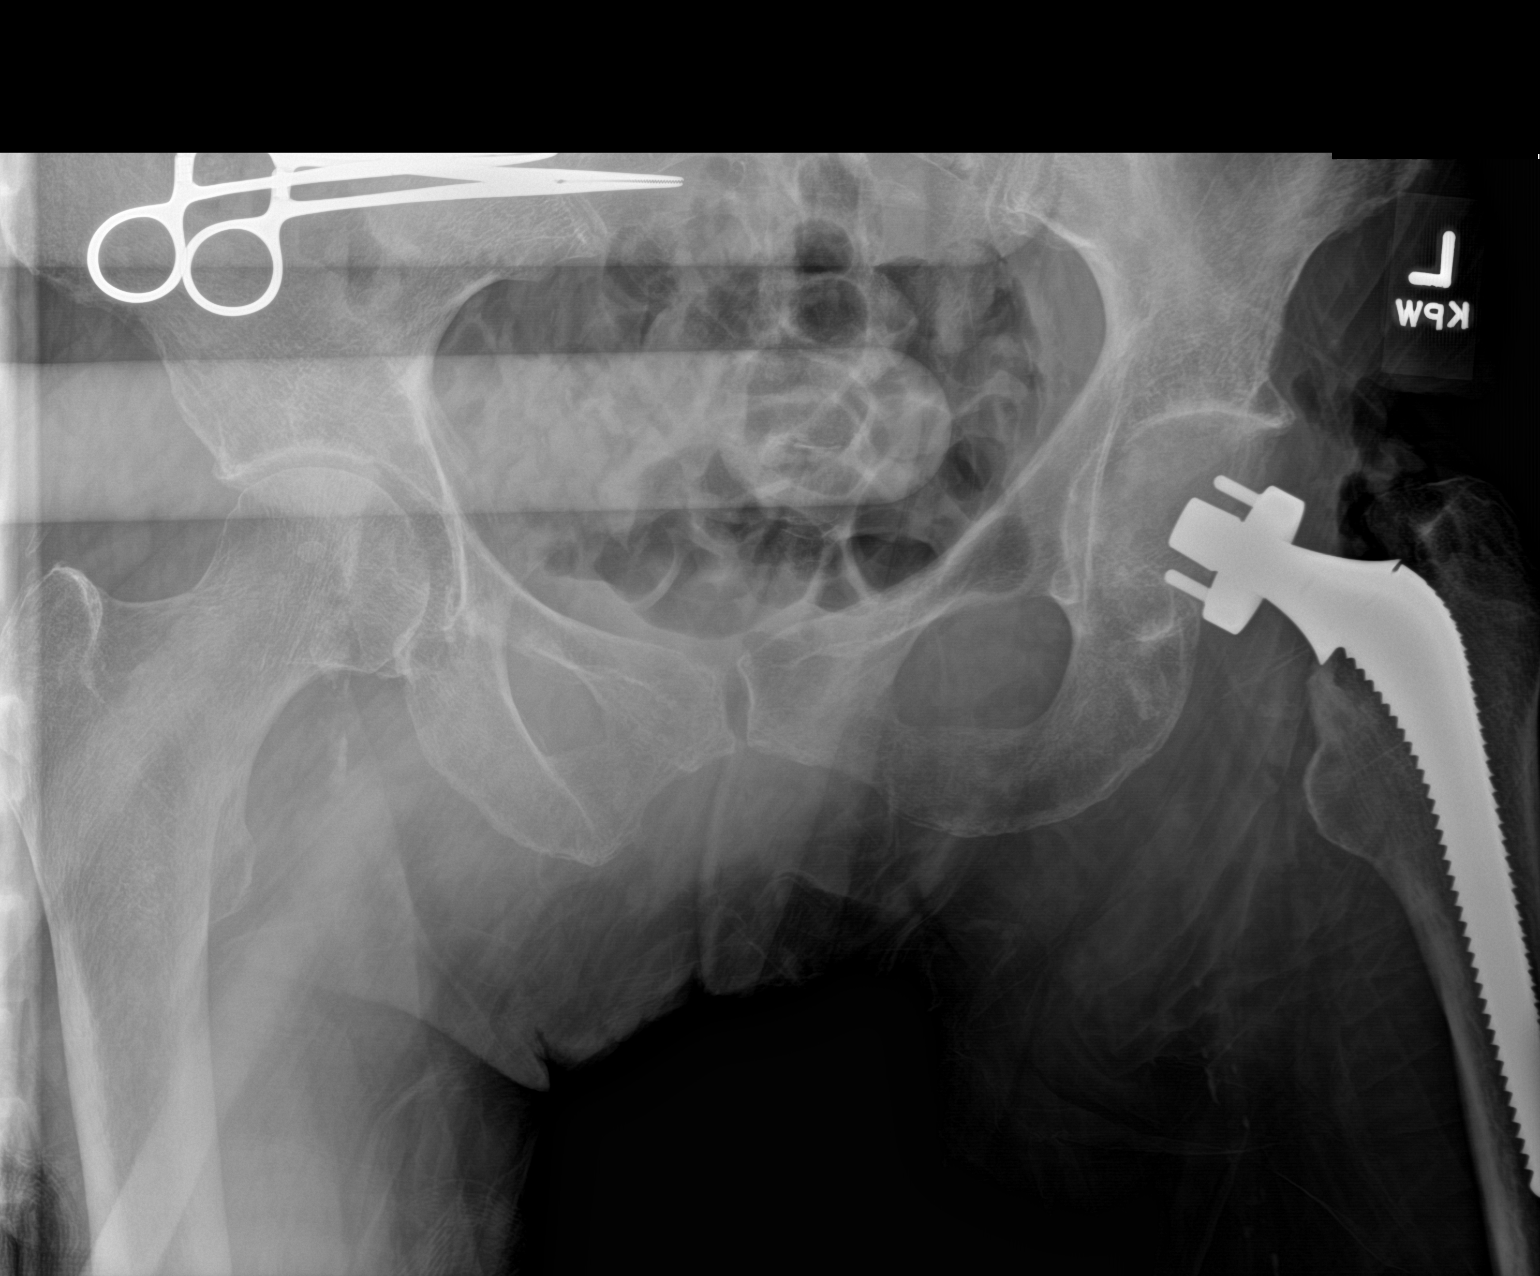

[pelvis ap (2 of 2)]
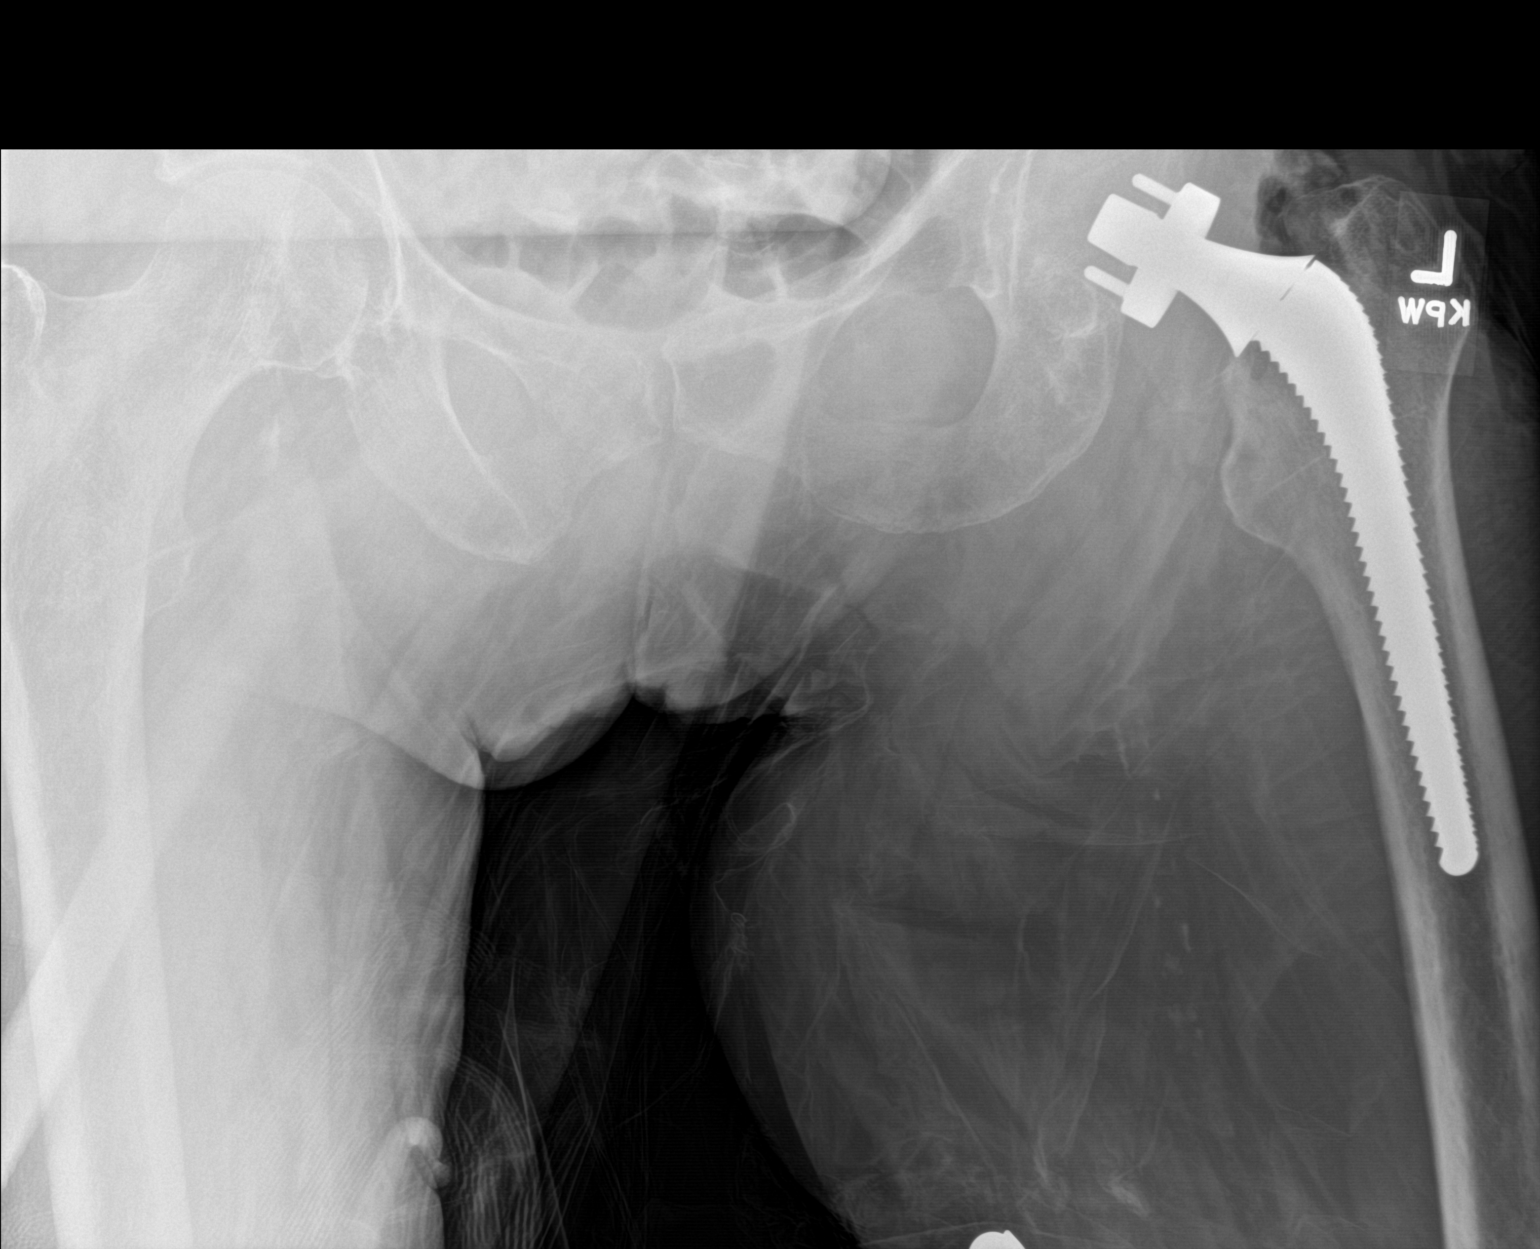

[2 of 2 positions shown; findings below may reference images not displayed]

FINDINGS: Left hip left hip hemiarthroplasty. Hardware intact. Anatomic
alignment. Hemostats noted over the right pelvis. Peripheral
vascular calcification noted.
IMPRESSION: Left hip hemiarthroplasty with anatomic alignment.
